# Patient Record
Sex: Female | Born: 1960
Health system: Southern US, Community
[De-identification: ages and names within clinical notes are randomized; demographics above are authoritative.]

## PROBLEM LIST (undated history)

## (undated) DIAGNOSIS — R7303 Prediabetes: Secondary | ICD-10-CM

## (undated) DIAGNOSIS — E559 Vitamin D deficiency, unspecified: Secondary | ICD-10-CM

## (undated) DIAGNOSIS — E785 Hyperlipidemia, unspecified: Secondary | ICD-10-CM

## (undated) DIAGNOSIS — I10 Essential (primary) hypertension: Secondary | ICD-10-CM

## (undated) DIAGNOSIS — D509 Iron deficiency anemia, unspecified: Secondary | ICD-10-CM

## (undated) DIAGNOSIS — D281 Benign neoplasm of vagina: Secondary | ICD-10-CM

## (undated) DIAGNOSIS — E040 Nontoxic diffuse goiter: Secondary | ICD-10-CM

## (undated) DIAGNOSIS — Z85828 Personal history of other malignant neoplasm of skin: Secondary | ICD-10-CM

## (undated) DIAGNOSIS — R5383 Other fatigue: Secondary | ICD-10-CM

## (undated) HISTORY — DX: Benign neoplasm of vagina: D28.1

## (undated) HISTORY — PX: BREAST CYST ASPIRATION: SHX578

## (undated) HISTORY — DX: Nontoxic diffuse goiter: E04.0

## (undated) HISTORY — DX: Vitamin D deficiency, unspecified: E55.9

## (undated) HISTORY — DX: Hyperlipidemia, unspecified: E78.5

## (undated) HISTORY — PX: APPENDECTOMY: SHX54

## (undated) HISTORY — DX: Essential (primary) hypertension: I10

## (undated) HISTORY — DX: Other fatigue: R53.83

## (undated) HISTORY — DX: Iron deficiency anemia, unspecified: D50.9

## (undated) HISTORY — DX: Personal history of other malignant neoplasm of skin: Z85.828

## (undated) HISTORY — DX: Prediabetes: R73.03

## (undated) HISTORY — PX: ABDOMINAL HYSTERECTOMY: SHX81

## (undated) HISTORY — PX: KNEE SURGERY: SHX244

---

## 2017-03-15 DIAGNOSIS — R5382 Chronic fatigue, unspecified: Secondary | ICD-10-CM | POA: Diagnosis not present

## 2017-03-15 DIAGNOSIS — E559 Vitamin D deficiency, unspecified: Secondary | ICD-10-CM | POA: Diagnosis not present

## 2017-03-15 DIAGNOSIS — E040 Nontoxic diffuse goiter: Secondary | ICD-10-CM | POA: Diagnosis not present

## 2017-03-15 DIAGNOSIS — E782 Mixed hyperlipidemia: Secondary | ICD-10-CM | POA: Diagnosis not present

## 2017-03-15 DIAGNOSIS — Z0000 Encounter for general adult medical examination without abnormal findings: Secondary | ICD-10-CM | POA: Diagnosis not present

## 2017-03-15 DIAGNOSIS — D509 Iron deficiency anemia, unspecified: Secondary | ICD-10-CM | POA: Diagnosis not present

## 2017-03-15 DIAGNOSIS — Z85828 Personal history of other malignant neoplasm of skin: Secondary | ICD-10-CM | POA: Diagnosis not present

## 2017-04-07 ENCOUNTER — Other Ambulatory Visit (HOSPITAL_BASED_OUTPATIENT_CLINIC_OR_DEPARTMENT_OTHER): Payer: Self-pay | Admitting: Internal Medicine

## 2017-04-07 DIAGNOSIS — Z1231 Encounter for screening mammogram for malignant neoplasm of breast: Secondary | ICD-10-CM

## 2017-04-12 ENCOUNTER — Encounter (HOSPITAL_BASED_OUTPATIENT_CLINIC_OR_DEPARTMENT_OTHER): Payer: Self-pay | Admitting: Radiology

## 2017-04-12 ENCOUNTER — Ambulatory Visit (HOSPITAL_BASED_OUTPATIENT_CLINIC_OR_DEPARTMENT_OTHER)
Admission: RE | Admit: 2017-04-12 | Discharge: 2017-04-12 | Disposition: A | Discharge status: Home / Self Care | Payer: 59 | Source: Ambulatory Visit | Attending: Internal Medicine | Admitting: Internal Medicine

## 2017-04-12 DIAGNOSIS — Z1231 Encounter for screening mammogram for malignant neoplasm of breast: Secondary | ICD-10-CM | POA: Diagnosis not present

## 2017-04-15 MED FILL — ATORVASTATIN 20 MG TABLET: 20 | 60 days supply | Qty: 60 | Fill #0

## 2017-04-15 MED FILL — LEVOTHYROXINE 75 MCG TABLET: 75 | 30 days supply | Qty: 30 | Fill #0

## 2017-05-07 DIAGNOSIS — R5382 Chronic fatigue, unspecified: Secondary | ICD-10-CM | POA: Diagnosis not present

## 2017-05-07 DIAGNOSIS — I10 Essential (primary) hypertension: Secondary | ICD-10-CM | POA: Diagnosis not present

## 2017-05-07 MED FILL — LOSARTAN POTASSIUM 50 MG TA: 50 | 90 days supply | Qty: 90 | Fill #0

## 2017-05-24 MED FILL — LEVOTHYROXINE 75 MCG TABLET: 75 | 30 days supply | Qty: 30 | Fill #1

## 2017-06-02 DIAGNOSIS — R748 Abnormal levels of other serum enzymes: Secondary | ICD-10-CM | POA: Diagnosis not present

## 2017-06-29 MED FILL — LEVOTHYROXINE 75 MCG TABLET: 75 | 90 days supply | Qty: 90 | Fill #0

## 2017-07-05 MED FILL — ATORVASTATIN 20 MG TABLET: 20 | 60 days supply | Qty: 60 | Fill #0

## 2017-08-09 MED FILL — LOSARTAN POTASSIUM 50 MG TA: 50 | 90 days supply | Qty: 90 | Fill #1

## 2017-08-24 DIAGNOSIS — M25461 Effusion, right knee: Secondary | ICD-10-CM | POA: Diagnosis not present

## 2017-08-24 DIAGNOSIS — S93421A Sprain of deltoid ligament of right ankle, initial encounter: Secondary | ICD-10-CM | POA: Diagnosis not present

## 2017-09-02 MED FILL — ATORVASTATIN 20 MG TABLET: 20 | 90 days supply | Qty: 90 | Fill #1

## 2017-09-29 DIAGNOSIS — M25461 Effusion, right knee: Secondary | ICD-10-CM | POA: Diagnosis not present

## 2017-09-29 DIAGNOSIS — M7661 Achilles tendinitis, right leg: Secondary | ICD-10-CM | POA: Diagnosis not present

## 2017-09-29 DIAGNOSIS — S93421A Sprain of deltoid ligament of right ankle, initial encounter: Secondary | ICD-10-CM | POA: Diagnosis not present

## 2017-09-30 MED FILL — LEVOTHYROXINE 75 MCG TABLET: 75 | 90 days supply | Qty: 90 | Fill #1

## 2017-11-02 MED FILL — LOSARTAN POTASSIUM 100 MG T: 100 | 90 days supply | Qty: 90 | Fill #0

## 2017-12-03 MED FILL — ATORVASTATIN 20 MG TABLET: 20 | 30 days supply | Qty: 30 | Fill #2

## 2017-12-29 MED FILL — LEVOTHYROXINE 75 MCG TABLET: 75 | 60 days supply | Qty: 60 | Fill #2

## 2017-12-29 MED FILL — ATORVASTATIN 20 MG TABLET: 20 | 60 days supply | Qty: 60 | Fill #0

## 2018-02-24 MED FILL — LEVOTHYROXINE 75 MCG TABLET: 75 | 90 days supply | Qty: 90 | Fill #0

## 2018-02-24 MED FILL — LOSARTAN POTASSIUM 100 MG T: 100 | 90 days supply | Qty: 90 | Fill #0

## 2018-03-21 MED FILL — ATORVASTATIN 20 MG TABLET: 20 | 60 days supply | Qty: 60 | Fill #0

## 2018-03-30 DIAGNOSIS — R7303 Prediabetes: Secondary | ICD-10-CM | POA: Diagnosis not present

## 2018-03-30 DIAGNOSIS — E559 Vitamin D deficiency, unspecified: Secondary | ICD-10-CM | POA: Diagnosis not present

## 2018-03-30 DIAGNOSIS — R5382 Chronic fatigue, unspecified: Secondary | ICD-10-CM | POA: Diagnosis not present

## 2018-03-30 DIAGNOSIS — Z0000 Encounter for general adult medical examination without abnormal findings: Secondary | ICD-10-CM | POA: Diagnosis not present

## 2018-03-30 DIAGNOSIS — E040 Nontoxic diffuse goiter: Secondary | ICD-10-CM | POA: Diagnosis not present

## 2018-03-30 DIAGNOSIS — E782 Mixed hyperlipidemia: Secondary | ICD-10-CM | POA: Diagnosis not present

## 2018-03-30 DIAGNOSIS — I10 Essential (primary) hypertension: Secondary | ICD-10-CM | POA: Diagnosis not present

## 2018-03-30 DIAGNOSIS — D509 Iron deficiency anemia, unspecified: Secondary | ICD-10-CM | POA: Diagnosis not present

## 2018-05-16 MED FILL — ATORVASTATIN CALCIUM 20 MG: 20 | 90 days supply | Qty: 90 | Fill #0

## 2018-05-31 MED FILL — LEVOTHYROXINE 75 MCG TABLET: 75 | 90 days supply | Qty: 90 | Fill #0

## 2018-08-18 MED FILL — ATORVASTATIN CALCIUM 20 MG: 20 | 90 days supply | Qty: 90 | Fill #1

## 2018-09-13 MED FILL — LEVOTHYROXINE 75 MCG TABLET: 75 | 90 days supply | Qty: 90 | Fill #1

## 2018-11-14 MED FILL — ATORVASTATIN 20 MG TABLET: 20 | 90 days supply | Qty: 90 | Fill #0

## 2018-11-14 MED FILL — LOSARTAN POTASSIUM 100 MG T: 100 | 90 days supply | Qty: 90 | Fill #0

## 2018-11-22 MED FILL — LEVOTHYROXINE 75 MCG TABLET: 75 | 90 days supply | Qty: 90 | Fill #0

## 2019-02-14 MED FILL — ATORVASTATIN 20 MG TABLET: 20 | 90 days supply | Qty: 90 | Fill #1

## 2019-03-15 MED FILL — LEVOTHYROXINE 75 MCG TABLET: 75 | 90 days supply | Qty: 90 | Fill #1

## 2019-04-12 DIAGNOSIS — E040 Nontoxic diffuse goiter: Secondary | ICD-10-CM | POA: Diagnosis not present

## 2019-04-12 DIAGNOSIS — Z0000 Encounter for general adult medical examination without abnormal findings: Secondary | ICD-10-CM | POA: Diagnosis not present

## 2019-04-12 DIAGNOSIS — R7303 Prediabetes: Secondary | ICD-10-CM | POA: Diagnosis not present

## 2019-04-12 DIAGNOSIS — E559 Vitamin D deficiency, unspecified: Secondary | ICD-10-CM | POA: Diagnosis not present

## 2019-04-12 DIAGNOSIS — D509 Iron deficiency anemia, unspecified: Secondary | ICD-10-CM | POA: Diagnosis not present

## 2019-04-12 DIAGNOSIS — R5382 Chronic fatigue, unspecified: Secondary | ICD-10-CM | POA: Diagnosis not present

## 2019-04-12 DIAGNOSIS — I10 Essential (primary) hypertension: Secondary | ICD-10-CM | POA: Diagnosis not present

## 2019-04-12 DIAGNOSIS — E782 Mixed hyperlipidemia: Secondary | ICD-10-CM | POA: Diagnosis not present

## 2019-04-12 DIAGNOSIS — Z1211 Encounter for screening for malignant neoplasm of colon: Secondary | ICD-10-CM | POA: Diagnosis not present

## 2019-04-25 ENCOUNTER — Other Ambulatory Visit: Payer: Self-pay | Admitting: Specialist

## 2019-04-25 DIAGNOSIS — Z1231 Encounter for screening mammogram for malignant neoplasm of breast: Secondary | ICD-10-CM

## 2019-05-03 ENCOUNTER — Encounter: Payer: Self-pay | Admitting: Gastroenterology

## 2019-05-08 ENCOUNTER — Other Ambulatory Visit: Payer: Self-pay

## 2019-05-08 ENCOUNTER — Encounter (HOSPITAL_BASED_OUTPATIENT_CLINIC_OR_DEPARTMENT_OTHER): Payer: Self-pay

## 2019-05-08 ENCOUNTER — Ambulatory Visit (HOSPITAL_BASED_OUTPATIENT_CLINIC_OR_DEPARTMENT_OTHER)
Admission: RE | Admit: 2019-05-08 | Discharge: 2019-05-08 | Disposition: A | Discharge status: Home / Self Care | Payer: 59 | Source: Ambulatory Visit | Attending: Specialist | Admitting: Specialist

## 2019-05-08 DIAGNOSIS — Z1231 Encounter for screening mammogram for malignant neoplasm of breast: Secondary | ICD-10-CM | POA: Insufficient documentation

## 2019-05-10 MED FILL — ATORVASTATIN 40 MG TABLET: 40 | 90 days supply | Qty: 90 | Fill #0

## 2019-05-10 MED FILL — LOSARTAN POTASSIUM 100 MG T: 100 | 90 days supply | Qty: 90 | Fill #1

## 2019-05-11 ENCOUNTER — Ambulatory Visit: Payer: 59 | Admitting: Gastroenterology

## 2019-05-11 ENCOUNTER — Encounter: Payer: Self-pay | Admitting: Gastroenterology

## 2019-05-11 ENCOUNTER — Other Ambulatory Visit: Payer: Self-pay

## 2019-05-11 VITALS — BP 124/84 | HR 72 | Temp 98.4°F | Ht 71.0 in | Wt 186.1 lb

## 2019-05-11 DIAGNOSIS — Z1212 Encounter for screening for malignant neoplasm of rectum: Secondary | ICD-10-CM

## 2019-05-11 DIAGNOSIS — Z1211 Encounter for screening for malignant neoplasm of colon: Secondary | ICD-10-CM | POA: Diagnosis not present

## 2019-05-11 MED ORDER — CLENPIQ 10-3.5-12 MG-GM -GM/160ML PO SOLN: 1.0000 | ORAL | 0 refills | Status: DC

## 2019-05-11 MED FILL — CLENPIQ 10-3.5-12 MG-GM -GM: 10-3.5-12 M | 1 days supply | Qty: 320 | Fill #0

## 2019-05-11 NOTE — Progress Notes (Signed)
Chief Complaint: For colorectal cancer screening  Referring Provider:  Raina Mina., MD      ASSESSMENT AND PLAN;   #1.  Colorectal cancer screening  Plan: - Proceed with colonoscopy with suprep/clenpiq. Discussed risks & benefits. Patient agrees to proceed. All the questions were answered. Consent forms given for review.  HPI:    Jackie Washington (Jackie Washington)is a 58 y.o. female  Dr Marthann Schiller wife No nausea, vomiting, regurgitation, odynophagia or dysphagia.  No significant diarrhea or constipation.  There is no melena or hematochezia. No unintentional weight loss.  Occ hearburn 1/month, better with Pepto and TUMS.  Occ problems with hoids. Better with prep H.   Past Medical History:  Diagnosis Date  . Fatigue   . Goiter diffuse, nontoxic   . History of basal cell carcinoma   . Hyperlipidemia   . Hypertension   . Iron deficiency anemia   . Prediabetes   . Vaginal neoplasm benign   . Vitamin D deficiency     Past Surgical History:  Procedure Laterality Date  . ABDOMINAL HYSTERECTOMY    . APPENDECTOMY    . BREAST CYST ASPIRATION     8 years ago unsure of laterality   . KNEE SURGERY      Family History  Problem Relation Age of Onset  . Colon cancer Neg Hx   . Esophageal cancer Neg Hx     Social History   Tobacco Use  . Smoking status: Never Smoker  . Smokeless tobacco: Never Used  Substance Use Topics  . Alcohol use: Yes    Comment: ocassionally  . Drug use: Not Currently    Current Outpatient Medications  Medication Sig Dispense Refill  . atorvastatin (LIPITOR) 20 MG tablet 1 tablet daily.    [name] levothyroxine (SYNTHROID) 75 MCG tablet 1 tablet daily.    [name] losartan (COZAAR) 50 MG tablet Take 50 mg by mouth daily.    . Multiple Vitamin (MULTI-VITAMIN) tablet Take 1 tablet by mouth daily.     No current facility-administered medications for this visit.     Not on File  Review of Systems:  Constitutional: Denies fever, chills, diaphoresis,  appetite change and fatigue.  HEENT: Denies photophobia, eye pain, redness, hearing loss, ear pain, congestion, sore throat, rhinorrhea, sneezing, mouth sores, neck pain, neck stiffness and tinnitus.   Respiratory: Denies SOB, DOE, cough, chest tightness,  and wheezing.   Cardiovascular: Denies chest pain, palpitations and leg swelling.  Genitourinary: Denies dysuria, urgency, frequency, hematuria, flank pain and difficulty urinating.  Musculoskeletal: Denies myalgias, back pain, joint swelling, arthralgias and gait problem.  Skin: No rash.  Neurological: Denies dizziness, seizures, syncope, weakness, light-headedness, numbness and headaches.  Hematological: Denies adenopathy. Easy bruising, personal or family bleeding history  Psychiatric/Behavioral: No anxiety or depression     Physical Exam:    BP 124/84   Pulse 72   Temp 98.4 F (36.9 C)   Ht 5\' 11"  (1.803 m)   Wt 186 lb 2 oz (84.4 kg)   BMI 25.96 kg/m  Filed Weights   05/11/19 1140  Weight: 186 lb 2 oz (84.4 kg)   Constitutional:  Well-developed, in no acute distress. Psychiatric: Normal mood and affect. Behavior is normal. HEENT: Pupils normal.  Conjunctivae are normal. No scleral icterus. Neck supple.  Cardiovascular: Normal rate, regular rhythm. No edema Pulmonary/chest: Effort normal and breath sounds normal. No wheezing, rales or rhonchi. Abdominal: Soft, nondistended. Nontender. Bowel sounds active throughout. There are no masses palpable. No hepatomegaly. Rectal:  defered Neurological: Alert and oriented to person place and time. Skin: Skin is warm and dry. No rashes noted.    Carmell Austria, MD 05/11/2019, 11:41 AM  Cc: Raina Mina., MD

## 2019-05-11 NOTE — Patient Instructions (Signed)
If you are age 57 or older, your body mass index should be between 23-30. Your Body mass index is 25.96 kg/m. If this is out of the aforementioned range listed, please consider follow up with your Primary Care Provider.  If you are age 58 or younger, your body mass index should be between 19-25. Your Body mass index is 25.96 kg/m. If this is out of the aformentioned range listed, please consider follow up with your Primary Care Provider.   To help prevent the possible spread of infection to our patients, communities, and staff; we will be implementing the following measures:  As of now we are not allowing any visitors/family members to accompany you to any upcoming appointments with [facility]. If you have any concerns about this please contact our office to discuss prior to the appointment.   We have sent the following medications to your pharmacy for you to pick up at your convenience: Clenpiq   You have been scheduled for a colonoscopy. Please follow written instructions given to you at your visit today.  Please pick up your prep supplies at the pharmacy within the next 1-3 days. If you use inhalers (even only as needed), please bring them with you on the day of your procedure. Your physician has requested that you go to www.startemmi.com and enter the access code given to you at your visit today. This web site gives a general overview about your procedure. However, you should still follow specific instructions given to you by our office regarding your preparation for the procedure.  Thank you,  Dr. Jackquline Denmark

## 2019-06-08 ENCOUNTER — Encounter: Payer: Self-pay | Admitting: Gastroenterology

## 2019-06-12 MED FILL — LEVOTHYROXINE 75 MCG TABLET: 75 | 90 days supply | Qty: 90 | Fill #0

## 2019-06-21 ENCOUNTER — Telehealth: Payer: Self-pay

## 2019-06-21 NOTE — Telephone Encounter (Signed)
Covid-19 screening questions   Do you now or have you had a fever in the last 14 days? NO   Do you have any respiratory symptoms of shortness of breath or cough now or in the last 14 days? NO  Do you have any family members or close contacts with diagnosed or suspected Covid-19 in the past 14 days? NO  Have you been tested for Covid-19 and found to be positive? NO        

## 2019-06-22 ENCOUNTER — Encounter: Payer: Self-pay | Admitting: Gastroenterology

## 2019-06-22 ENCOUNTER — Other Ambulatory Visit: Payer: Self-pay

## 2019-06-22 ENCOUNTER — Ambulatory Visit (AMBULATORY_SURGERY_CENTER): Payer: 59 | Admitting: Gastroenterology

## 2019-06-22 VITALS — BP 128/84 | HR 65 | Temp 97.7°F | Resp 11 | Ht 71.0 in | Wt 186.0 lb

## 2019-06-22 DIAGNOSIS — D122 Benign neoplasm of ascending colon: Secondary | ICD-10-CM | POA: Diagnosis not present

## 2019-06-22 DIAGNOSIS — Z1211 Encounter for screening for malignant neoplasm of colon: Secondary | ICD-10-CM

## 2019-06-22 MED ORDER — SODIUM CHLORIDE 0.9 % IV SOLN: 500.0000 mL | Freq: Once | INTRAVENOUS | Status: DC

## 2019-06-22 NOTE — Op Note (Signed)
[place] Patient Name: Jackie Washington Procedure Date: 06/22/2019 10:20 AM MRN: [id number] Endoscopist: Jackquline Denmark , MD Age: 58 Referring MD:  Date of Birth: May 23, 1961 Gender: Female Account #: [id number] Procedure:                Colonoscopy Indications:              Screening for colorectal malignant neoplasm Medicines:                Monitored Anesthesia Care Procedure:                Pre-Anesthesia Assessment:                           - Prior to the procedure, a History and Physical                            was performed, and patient medications and                            allergies were reviewed. The patient's tolerance of                            previous anesthesia was also reviewed. The risks                            and benefits of the procedure and the sedation                            options and risks were discussed with the patient.                            All questions were answered, and informed consent                            was obtained. Prior Anticoagulants: The patient has                            taken no previous anticoagulant or antiplatelet                            agents. ASA Grade Assessment: I - A normal, healthy                            patient. After reviewing the risks and benefits,                            the patient was deemed in satisfactory condition to                            undergo the procedure.                           After obtaining informed consent, the colonoscope  was passed under direct vision. Throughout the                            procedure, the patient's blood pressure, pulse, and                            oxygen saturations were monitored continuously. The                            Colonoscope was introduced through the anus and                            advanced to the 2 cm into the ileum. The                            colonoscopy was performed  without difficulty. The                            patient tolerated the procedure well. The quality                            of the bowel preparation was excellent. The                            terminal ileum, ileocecal valve, appendiceal                            orifice, and rectum were photographed. Scope In: 10:28:21 AM Scope Out: 10:39:15 AM Scope Withdrawal Time: 0 hours 7 minutes 10 seconds  Total Procedure Duration: 0 hours 10 minutes 54 seconds  Findings:                 A 4 mm polyp was found in the proximal ascending                            colon. The polyp was sessile. The polyp was removed                            with a cold biopsy forceps. Resection and retrieval                            were complete.                           A few small-mouthed diverticula were found in the                            sigmoid colon.                           Non-bleeding internal hemorrhoids were found during                            retroflexion. The hemorrhoids were small.  The terminal ileum appeared normal.                           The exam was otherwise without abnormality. Complications:            No immediate complications. Estimated Blood Loss:     Estimated blood loss: none. Impression:               -Small colonic polyp s/p polypectomy.                           -Mild sigmoid diverticulosis                           -Otherwise normal colonoscopy to TI. Recommendation:           - Patient has a contact number available for                            emergencies. The signs and symptoms of potential                            delayed complications were discussed with the                            patient. Return to normal activities tomorrow.                            Written discharge instructions were provided to the                            patient.                           - Resume previous diet.                           -  Continue present medications.                           - Await pathology results.                           - Repeat colonoscopy for surveillance based on                            pathology results.                           - Return to GI clinic PRN. Jackquline Denmark, MD 06/22/2019 10:43:19 AM This report has been signed electronically.

## 2019-06-22 NOTE — Patient Instructions (Signed)
HANDOUTS PROVIDED ON: POLYPS & DIVERTICULOSIS  THE POLYP REMOVED HAS BEEN SENT OFF FOR PATHOLOGY.  THE RESULTS USUALLY TAKE 10-14 DAYS TO RECEIVE.  YOU MAY RESUME YOUR PREVIOUS DIET AND MEDICATION SCHEDULE.  YOU HAD AN ENDOSCOPIC PROCEDURE TODAY AT [facility]:   Refer to the procedure report that was given to you for any specific questions about what was found during the examination.  If the procedure report does not answer your questions, please call your gastroenterologist to clarify.  If you requested that your care partner not be given the details of your procedure findings, then the procedure report has been included in a sealed envelope for you to review at your convenience later.  YOU SHOULD EXPECT: Some feelings of bloating in the abdomen. Passage of more gas than usual.  Walking can help get rid of the air that was put into your GI tract during the procedure and reduce the bloating. If you had a lower endoscopy (such as a colonoscopy or flexible sigmoidoscopy) you may notice spotting of blood in your stool or on the toilet paper. If you underwent a bowel prep for your procedure, you may not have a normal bowel movement for a few days.  Please Note:  You might notice some irritation and congestion in your nose or some drainage.  This is from the oxygen used during your procedure.  There is no need for concern and it should clear up in a day or so.  SYMPTOMS TO REPORT IMMEDIATELY:   Following lower endoscopy (colonoscopy or flexible sigmoidoscopy):  Excessive amounts of blood in the stool  Significant tenderness or worsening of abdominal pains  Swelling of the abdomen that is new, acute  Fever of 100F or higher  For urgent or emergent issues, a gastroenterologist can be reached at any hour by calling [phone].   DIET:  We do recommend a small meal at first, but then you may proceed to your regular diet.  Drink plenty of fluids but you should avoid alcoholic  beverages for 24 hours.  ACTIVITY:  You should plan to take it easy for the rest of today and you should NOT DRIVE or use heavy machinery until tomorrow (because of the sedation medicines used during the test).    FOLLOW UP: Our staff will call the number listed on your records 48-72 hours following your procedure to check on you and address any questions or concerns that you may have regarding the information given to you following your procedure. If we do not reach you, we will leave a message.  We will attempt to reach you two times.  During this call, we will ask if you have developed any symptoms of COVID 19. If you develop any symptoms (ie: fever, flu-like symptoms, shortness of breath, cough etc.) before then, please call [phone].  If you test positive for Covid 19 in the 2 weeks post procedure, please call and report this information to [place].    If any biopsies were taken you will be contacted by phone or by letter within the next 1-3 weeks.  Please call us at [phone] if you have not heard about the biopsies in 3 weeks.    SIGNATURES/CONFIDENTIALITY: You and/or your care partner have signed paperwork which will be entered into your electronic medical record.  These signatures attest to the fact that that the information above on your After Visit Summary has been reviewed and is understood.  Full responsibility of the confidentiality of this  discharge information lies with you and/or your care-partner. 

## 2019-06-22 NOTE — Progress Notes (Signed)
Called to room to assist during endoscopic procedure.  Patient ID and intended procedure confirmed with present staff. Received instructions for my participation in the procedure from the performing physician.  

## 2019-06-22 NOTE — Progress Notes (Signed)
PT taken to PACU. Monitors in place. VSS. Report given to RN. 

## 2019-06-22 NOTE — Progress Notes (Signed)
Pt's states no medical or surgical changes since previsit or office visit.  JB - temp CW - vitals. 

## 2019-06-26 ENCOUNTER — Encounter: Payer: Self-pay | Admitting: Gastroenterology

## 2019-06-26 ENCOUNTER — Telehealth: Payer: Self-pay

## 2019-06-26 NOTE — Telephone Encounter (Signed)
  Follow up Call-  Call back number 06/22/2019  Post procedure Call Back phone  # [phone]  Permission to leave phone message Yes     Patient questions:  Do you have a fever, pain , or abdominal swelling? No. Pain Score  0 *  Have you tolerated food without any problems? Yes.    Have you been able to return to your normal activities? Yes.    Do you have any questions about your discharge instructions: Diet   No. Medications  No. Follow up visit  No.  Do you have questions or concerns about your Care? No.  Actions: * If pain score is 4 or above: No action needed, pain <4. 1. Have you developed a fever since your procedure? no  2.   Have you had an respiratory symptoms (SOB or cough) since your procedure? no  3.   Have you tested positive for COVID 19 since your procedure no  4.   Have you had any family members/close contacts diagnosed with the COVID 19 since your procedure?  no   If yes to any of these questions please route to Joylene John, RN and Alphonsa Gin, Therapist, sports.

## 2019-07-10 ENCOUNTER — Other Ambulatory Visit: Payer: Self-pay

## 2019-07-10 DIAGNOSIS — Z20822 Contact with and (suspected) exposure to covid-19: Secondary | ICD-10-CM

## 2019-07-11 LAB — NOVEL CORONAVIRUS, NAA: SARS-CoV-2, NAA: NOT DETECTED

## 2019-08-08 MED FILL — ATORVASTATIN 40 MG TABLET: 40 | 90 days supply | Qty: 90 | Fill #1

## 2019-09-20 MED FILL — LEVOTHYROXINE 75 MCG TABLET: 75 | 90 days supply | Qty: 90 | Fill #1

## 2019-11-02 MED FILL — LOSARTAN POTASSIUM 100 MG T: 100 | 90 days supply | Qty: 90 | Fill #0

## 2019-11-08 MED FILL — ATORVASTATIN 40 MG TABLET: 40 | 90 days supply | Qty: 90 | Fill #2

## 2019-12-06 DIAGNOSIS — R5382 Chronic fatigue, unspecified: Secondary | ICD-10-CM | POA: Diagnosis not present

## 2019-12-06 DIAGNOSIS — I10 Essential (primary) hypertension: Secondary | ICD-10-CM | POA: Diagnosis not present

## 2019-12-06 DIAGNOSIS — D509 Iron deficiency anemia, unspecified: Secondary | ICD-10-CM | POA: Diagnosis not present

## 2019-12-06 DIAGNOSIS — E782 Mixed hyperlipidemia: Secondary | ICD-10-CM | POA: Diagnosis not present

## 2019-12-06 DIAGNOSIS — R7303 Prediabetes: Secondary | ICD-10-CM | POA: Diagnosis not present

## 2019-12-06 DIAGNOSIS — Z85828 Personal history of other malignant neoplasm of skin: Secondary | ICD-10-CM | POA: Diagnosis not present

## 2019-12-06 DIAGNOSIS — E039 Hypothyroidism, unspecified: Secondary | ICD-10-CM | POA: Diagnosis not present

## 2019-12-06 DIAGNOSIS — E559 Vitamin D deficiency, unspecified: Secondary | ICD-10-CM | POA: Diagnosis not present

## 2019-12-06 DIAGNOSIS — D126 Benign neoplasm of colon, unspecified: Secondary | ICD-10-CM | POA: Diagnosis not present

## 2019-12-14 DIAGNOSIS — H43391 Other vitreous opacities, right eye: Secondary | ICD-10-CM | POA: Diagnosis not present

## 2019-12-14 DIAGNOSIS — H53141 Visual discomfort, right eye: Secondary | ICD-10-CM | POA: Diagnosis not present

## 2019-12-14 DIAGNOSIS — H21341 Primary cyst of pars plana, right eye: Secondary | ICD-10-CM | POA: Diagnosis not present

## 2019-12-14 DIAGNOSIS — H25013 Cortical age-related cataract, bilateral: Secondary | ICD-10-CM | POA: Diagnosis not present

## 2019-12-14 DIAGNOSIS — H43811 Vitreous degeneration, right eye: Secondary | ICD-10-CM | POA: Diagnosis not present

## 2019-12-21 MED FILL — LEVOTHYROXINE SODIUM 75 MCG: 75 | 90 days supply | Qty: 90 | Fill #0

## 2019-12-28 DIAGNOSIS — H53141 Visual discomfort, right eye: Secondary | ICD-10-CM | POA: Diagnosis not present

## 2019-12-28 DIAGNOSIS — H43391 Other vitreous opacities, right eye: Secondary | ICD-10-CM | POA: Diagnosis not present

## 2019-12-28 DIAGNOSIS — H21341 Primary cyst of pars plana, right eye: Secondary | ICD-10-CM | POA: Diagnosis not present

## 2019-12-28 DIAGNOSIS — H43811 Vitreous degeneration, right eye: Secondary | ICD-10-CM | POA: Diagnosis not present

## 2020-02-08 MED FILL — ATORVASTATIN CALCIUM 40 MG: 40 | 90 days supply | Qty: 90 | Fill #3

## 2020-02-28 DIAGNOSIS — H21341 Primary cyst of pars plana, right eye: Secondary | ICD-10-CM | POA: Diagnosis not present

## 2020-02-28 DIAGNOSIS — H43391 Other vitreous opacities, right eye: Secondary | ICD-10-CM | POA: Diagnosis not present

## 2020-02-28 DIAGNOSIS — H43811 Vitreous degeneration, right eye: Secondary | ICD-10-CM | POA: Diagnosis not present

## 2020-03-19 MED FILL — LEVOTHYROXINE SODIUM 75 MCG: 75 | 90 days supply | Qty: 90 | Fill #1

## 2020-05-07 ENCOUNTER — Other Ambulatory Visit (HOSPITAL_BASED_OUTPATIENT_CLINIC_OR_DEPARTMENT_OTHER): Payer: Self-pay | Admitting: Specialist

## 2020-05-07 MED FILL — ATORVASTATIN CALCIUM 40 MG: 40 | 90 days supply | Qty: 90 | Fill #0

## 2020-05-07 MED FILL — LOSARTAN POTASSIUM 100 MG T: 100 | 90 days supply | Qty: 90 | Fill #0

## 2020-05-28 ENCOUNTER — Other Ambulatory Visit (HOSPITAL_BASED_OUTPATIENT_CLINIC_OR_DEPARTMENT_OTHER): Payer: Self-pay | Admitting: Internal Medicine

## 2020-05-28 DIAGNOSIS — Z1231 Encounter for screening mammogram for malignant neoplasm of breast: Secondary | ICD-10-CM

## 2020-05-30 ENCOUNTER — Ambulatory Visit (HOSPITAL_BASED_OUTPATIENT_CLINIC_OR_DEPARTMENT_OTHER): Payer: 59

## 2020-06-14 ENCOUNTER — Other Ambulatory Visit (HOSPITAL_BASED_OUTPATIENT_CLINIC_OR_DEPARTMENT_OTHER): Payer: Self-pay | Admitting: Specialist

## 2020-06-14 MED FILL — LEVOTHYROXINE SODIUM 75 MCG: 75 | 90 days supply | Qty: 90 | Fill #0

## 2020-06-28 ENCOUNTER — Other Ambulatory Visit: Payer: Self-pay | Admitting: Internal Medicine

## 2020-06-28 DIAGNOSIS — Z1231 Encounter for screening mammogram for malignant neoplasm of breast: Secondary | ICD-10-CM

## 2020-08-06 ENCOUNTER — Ambulatory Visit
Admission: RE | Admit: 2020-08-06 | Discharge: 2020-08-06 | Disposition: A | Discharge status: Home / Self Care | Payer: 59 | Source: Ambulatory Visit | Attending: Internal Medicine | Admitting: Internal Medicine

## 2020-08-06 ENCOUNTER — Other Ambulatory Visit: Payer: Self-pay

## 2020-08-06 DIAGNOSIS — Z1231 Encounter for screening mammogram for malignant neoplasm of breast: Secondary | ICD-10-CM

## 2020-08-06 MED FILL — ATORVASTATIN CALCIUM 40 MG: 40 | 90 days supply | Qty: 90 | Fill #1

## 2020-08-25 DIAGNOSIS — Z23 Encounter for immunization: Secondary | ICD-10-CM | POA: Diagnosis not present

## 2020-09-09 ENCOUNTER — Ambulatory Visit (HOSPITAL_BASED_OUTPATIENT_CLINIC_OR_DEPARTMENT_OTHER): Payer: 59

## 2020-09-18 ENCOUNTER — Other Ambulatory Visit (HOSPITAL_BASED_OUTPATIENT_CLINIC_OR_DEPARTMENT_OTHER): Payer: Self-pay | Admitting: Specialist

## 2020-09-18 MED FILL — LEVOTHYROXINE SODIUM 75 MCG: 75 | 90 days supply | Qty: 90 | Fill #0

## 2020-10-30 DIAGNOSIS — M1611 Unilateral primary osteoarthritis, right hip: Secondary | ICD-10-CM | POA: Diagnosis not present

## 2020-10-30 DIAGNOSIS — M47816 Spondylosis without myelopathy or radiculopathy, lumbar region: Secondary | ICD-10-CM | POA: Diagnosis not present

## 2020-11-05 ENCOUNTER — Other Ambulatory Visit (HOSPITAL_BASED_OUTPATIENT_CLINIC_OR_DEPARTMENT_OTHER): Payer: Self-pay | Admitting: Internal Medicine

## 2020-11-05 MED FILL — LOSARTAN POTASSIUM 100 MG T: 100 | 90 days supply | Qty: 90 | Fill #0

## 2020-11-06 MED FILL — ATORVASTATIN CALCIUM 40 MG: 40 | 90 days supply | Qty: 90 | Fill #2

## 2020-12-16 ENCOUNTER — Other Ambulatory Visit (HOSPITAL_BASED_OUTPATIENT_CLINIC_OR_DEPARTMENT_OTHER): Payer: Self-pay

## 2020-12-17 ENCOUNTER — Other Ambulatory Visit (HOSPITAL_BASED_OUTPATIENT_CLINIC_OR_DEPARTMENT_OTHER): Payer: Self-pay

## 2020-12-17 MED ORDER — LEVOTHYROXINE SODIUM 75 MCG PO TABS
ORAL_TABLET | ORAL | 0 refills | Status: DC
  Filled 2020-12-17: qty 90, 90d supply, fill #0

## 2020-12-18 ENCOUNTER — Other Ambulatory Visit (HOSPITAL_BASED_OUTPATIENT_CLINIC_OR_DEPARTMENT_OTHER): Payer: Self-pay

## 2021-02-04 ENCOUNTER — Other Ambulatory Visit (HOSPITAL_BASED_OUTPATIENT_CLINIC_OR_DEPARTMENT_OTHER): Payer: Self-pay

## 2021-02-04 MED FILL — Atorvastatin Calcium Tab 40 MG (Base Equivalent): ORAL | 90 days supply | Qty: 90 | Fill #0 | Status: AC

## 2021-02-18 DIAGNOSIS — L820 Inflamed seborrheic keratosis: Secondary | ICD-10-CM | POA: Diagnosis not present

## 2021-02-18 DIAGNOSIS — D2371 Other benign neoplasm of skin of right lower limb, including hip: Secondary | ICD-10-CM | POA: Diagnosis not present

## 2021-02-18 DIAGNOSIS — L821 Other seborrheic keratosis: Secondary | ICD-10-CM | POA: Diagnosis not present

## 2021-02-18 DIAGNOSIS — L578 Other skin changes due to chronic exposure to nonionizing radiation: Secondary | ICD-10-CM | POA: Diagnosis not present

## 2021-03-15 ENCOUNTER — Other Ambulatory Visit (HOSPITAL_BASED_OUTPATIENT_CLINIC_OR_DEPARTMENT_OTHER): Payer: Self-pay

## 2021-03-17 ENCOUNTER — Other Ambulatory Visit (HOSPITAL_BASED_OUTPATIENT_CLINIC_OR_DEPARTMENT_OTHER): Payer: Self-pay

## 2021-03-17 MED ORDER — LEVOTHYROXINE SODIUM 75 MCG PO TABS
ORAL_TABLET | ORAL | 0 refills | Status: DC
  Filled 2021-03-17: qty 90, 90d supply, fill #0

## 2021-04-23 ENCOUNTER — Other Ambulatory Visit (HOSPITAL_BASED_OUTPATIENT_CLINIC_OR_DEPARTMENT_OTHER): Payer: Self-pay

## 2021-04-23 DIAGNOSIS — E049 Nontoxic goiter, unspecified: Secondary | ICD-10-CM | POA: Diagnosis not present

## 2021-04-23 DIAGNOSIS — Z0001 Encounter for general adult medical examination with abnormal findings: Secondary | ICD-10-CM | POA: Diagnosis not present

## 2021-04-23 DIAGNOSIS — I10 Essential (primary) hypertension: Secondary | ICD-10-CM | POA: Diagnosis not present

## 2021-04-23 DIAGNOSIS — Z79899 Other long term (current) drug therapy: Secondary | ICD-10-CM | POA: Diagnosis not present

## 2021-04-23 DIAGNOSIS — E559 Vitamin D deficiency, unspecified: Secondary | ICD-10-CM | POA: Diagnosis not present

## 2021-04-23 DIAGNOSIS — E782 Mixed hyperlipidemia: Secondary | ICD-10-CM | POA: Diagnosis not present

## 2021-04-23 DIAGNOSIS — Z0000 Encounter for general adult medical examination without abnormal findings: Secondary | ICD-10-CM | POA: Diagnosis not present

## 2021-04-23 DIAGNOSIS — R5382 Chronic fatigue, unspecified: Secondary | ICD-10-CM | POA: Diagnosis not present

## 2021-04-23 DIAGNOSIS — E040 Nontoxic diffuse goiter: Secondary | ICD-10-CM | POA: Diagnosis not present

## 2021-04-23 DIAGNOSIS — D509 Iron deficiency anemia, unspecified: Secondary | ICD-10-CM | POA: Diagnosis not present

## 2021-04-23 MED ORDER — LEVOTHYROXINE SODIUM 75 MCG PO TABS
75.0000 ug | ORAL_TABLET | Freq: Every day | ORAL | 3 refills | Status: DC
  Filled 2021-04-23 – 2021-06-24 (×2): qty 90, 90d supply, fill #0
  Filled 2021-09-23: qty 90, 90d supply, fill #1
  Filled 2021-12-22: qty 90, 90d supply, fill #2
  Filled 2022-03-12: qty 90, 90d supply, fill #3

## 2021-04-23 MED ORDER — ATORVASTATIN CALCIUM 40 MG PO TABS
40.0000 mg | ORAL_TABLET | Freq: Every day | ORAL | 3 refills | Status: DC
  Filled 2021-04-23: qty 90, 90d supply, fill #0

## 2021-04-23 MED ORDER — LOSARTAN POTASSIUM 50 MG PO TABS
50.0000 mg | ORAL_TABLET | Freq: Every day | ORAL | 3 refills | Status: DC
Start: 2021-04-23 — End: 2022-05-14
  Filled 2021-04-23: qty 90, 90d supply, fill #0
  Filled 2021-08-12: qty 90, 90d supply, fill #1
  Filled 2021-11-13: qty 90, 90d supply, fill #2
  Filled 2022-02-05: qty 90, 90d supply, fill #3

## 2021-04-25 ENCOUNTER — Other Ambulatory Visit (HOSPITAL_BASED_OUTPATIENT_CLINIC_OR_DEPARTMENT_OTHER): Payer: Self-pay

## 2021-04-25 MED ORDER — ATORVASTATIN CALCIUM 80 MG PO TABS
80.0000 mg | ORAL_TABLET | Freq: Every day | ORAL | 3 refills | Status: DC
  Filled 2021-04-25: qty 90, 90d supply, fill #0
  Filled 2021-09-23: qty 90, 90d supply, fill #1
  Filled 2021-12-22: qty 90, 90d supply, fill #2
  Filled 2022-03-12: qty 90, 90d supply, fill #3

## 2021-05-13 DIAGNOSIS — R5382 Chronic fatigue, unspecified: Secondary | ICD-10-CM | POA: Diagnosis not present

## 2021-06-24 ENCOUNTER — Other Ambulatory Visit (HOSPITAL_BASED_OUTPATIENT_CLINIC_OR_DEPARTMENT_OTHER): Payer: Self-pay

## 2021-08-12 ENCOUNTER — Other Ambulatory Visit (HOSPITAL_BASED_OUTPATIENT_CLINIC_OR_DEPARTMENT_OTHER): Payer: Self-pay

## 2021-09-23 ENCOUNTER — Other Ambulatory Visit (HOSPITAL_BASED_OUTPATIENT_CLINIC_OR_DEPARTMENT_OTHER): Payer: Self-pay

## 2021-09-29 ENCOUNTER — Other Ambulatory Visit (HOSPITAL_BASED_OUTPATIENT_CLINIC_OR_DEPARTMENT_OTHER): Payer: Self-pay | Admitting: Internal Medicine

## 2021-09-29 DIAGNOSIS — Z1231 Encounter for screening mammogram for malignant neoplasm of breast: Secondary | ICD-10-CM

## 2021-10-06 ENCOUNTER — Ambulatory Visit (HOSPITAL_BASED_OUTPATIENT_CLINIC_OR_DEPARTMENT_OTHER)
Admission: RE | Admit: 2021-10-06 | Discharge: 2021-10-06 | Disposition: A | Discharge status: Home / Self Care | Payer: 59 | Source: Ambulatory Visit | Attending: Internal Medicine | Admitting: Internal Medicine

## 2021-10-06 ENCOUNTER — Encounter (HOSPITAL_BASED_OUTPATIENT_CLINIC_OR_DEPARTMENT_OTHER): Payer: Self-pay

## 2021-10-06 ENCOUNTER — Other Ambulatory Visit: Payer: Self-pay

## 2021-10-06 DIAGNOSIS — Z1231 Encounter for screening mammogram for malignant neoplasm of breast: Secondary | ICD-10-CM | POA: Insufficient documentation

## 2021-11-13 ENCOUNTER — Other Ambulatory Visit (HOSPITAL_BASED_OUTPATIENT_CLINIC_OR_DEPARTMENT_OTHER): Payer: Self-pay

## 2021-12-22 ENCOUNTER — Other Ambulatory Visit (HOSPITAL_BASED_OUTPATIENT_CLINIC_OR_DEPARTMENT_OTHER): Payer: Self-pay

## 2022-02-05 ENCOUNTER — Other Ambulatory Visit (HOSPITAL_BASED_OUTPATIENT_CLINIC_OR_DEPARTMENT_OTHER): Payer: Self-pay

## 2022-02-27 ENCOUNTER — Other Ambulatory Visit (HOSPITAL_BASED_OUTPATIENT_CLINIC_OR_DEPARTMENT_OTHER): Payer: Self-pay

## 2022-03-12 ENCOUNTER — Other Ambulatory Visit (HOSPITAL_BASED_OUTPATIENT_CLINIC_OR_DEPARTMENT_OTHER): Payer: Self-pay

## 2022-03-16 ENCOUNTER — Other Ambulatory Visit (HOSPITAL_BASED_OUTPATIENT_CLINIC_OR_DEPARTMENT_OTHER): Payer: Self-pay

## 2022-03-17 ENCOUNTER — Other Ambulatory Visit (HOSPITAL_BASED_OUTPATIENT_CLINIC_OR_DEPARTMENT_OTHER): Payer: Self-pay

## 2022-03-17 MED ORDER — LEVOTHYROXINE SODIUM 75 MCG PO TABS
75.0000 ug | ORAL_TABLET | Freq: Every day | ORAL | 3 refills | Status: DC
Start: 2022-03-17 — End: 2023-03-29
  Filled 2022-03-17 (×3): qty 90, 90d supply, fill #0

## 2022-04-10 ENCOUNTER — Other Ambulatory Visit (HOSPITAL_BASED_OUTPATIENT_CLINIC_OR_DEPARTMENT_OTHER): Payer: Self-pay

## 2022-05-13 ENCOUNTER — Other Ambulatory Visit (HOSPITAL_BASED_OUTPATIENT_CLINIC_OR_DEPARTMENT_OTHER): Payer: Self-pay

## 2022-05-14 ENCOUNTER — Other Ambulatory Visit (HOSPITAL_BASED_OUTPATIENT_CLINIC_OR_DEPARTMENT_OTHER): Payer: Self-pay

## 2022-05-14 MED ORDER — LOSARTAN POTASSIUM 50 MG PO TABS
50.0000 mg | ORAL_TABLET | Freq: Every day | ORAL | 1 refills | Status: DC
  Filled 2022-05-14: qty 90, 90d supply, fill #0
  Filled 2022-06-25 – 2022-08-10 (×2): qty 90, 90d supply, fill #1

## 2022-06-02 ENCOUNTER — Other Ambulatory Visit (HOSPITAL_BASED_OUTPATIENT_CLINIC_OR_DEPARTMENT_OTHER): Payer: Self-pay

## 2022-06-02 ENCOUNTER — Other Ambulatory Visit (HOSPITAL_COMMUNITY): Payer: Self-pay

## 2022-06-02 DIAGNOSIS — R011 Cardiac murmur, unspecified: Secondary | ICD-10-CM | POA: Diagnosis not present

## 2022-06-02 DIAGNOSIS — E039 Hypothyroidism, unspecified: Secondary | ICD-10-CM | POA: Diagnosis not present

## 2022-06-02 DIAGNOSIS — Z1331 Encounter for screening for depression: Secondary | ICD-10-CM | POA: Diagnosis not present

## 2022-06-02 DIAGNOSIS — I10 Essential (primary) hypertension: Secondary | ICD-10-CM | POA: Diagnosis not present

## 2022-06-02 DIAGNOSIS — Z6827 Body mass index (BMI) 27.0-27.9, adult: Secondary | ICD-10-CM | POA: Diagnosis not present

## 2022-06-02 MED ORDER — LEVOTHYROXINE SODIUM 75 MCG PO TABS: 75.0000 ug | ORAL_TABLET | Freq: Every day | ORAL | 3 refills | Status: DC

## 2022-06-02 MED ORDER — ATORVASTATIN CALCIUM 80 MG PO TABS
80.0000 mg | ORAL_TABLET | Freq: Every day | ORAL | 3 refills | Status: DC
  Filled 2022-06-25: qty 90, 90d supply, fill #0
  Filled 2022-09-22: qty 90, 90d supply, fill #1
  Filled 2022-12-25: qty 90, 90d supply, fill #2
  Filled 2023-03-23: qty 90, 90d supply, fill #3

## 2022-06-02 MED ORDER — LOSARTAN POTASSIUM 50 MG PO TABS: 50.0000 mg | ORAL_TABLET | Freq: Every day | ORAL | 3 refills | Status: DC

## 2022-06-02 MED ORDER — LEVOTHYROXINE SODIUM 75 MCG PO TABS
75.0000 ug | ORAL_TABLET | Freq: Every day | ORAL | 3 refills | Status: DC
Start: 2022-06-02 — End: 2023-03-29

## 2022-06-08 ENCOUNTER — Other Ambulatory Visit (HOSPITAL_BASED_OUTPATIENT_CLINIC_OR_DEPARTMENT_OTHER): Payer: Self-pay

## 2022-06-08 DIAGNOSIS — R011 Cardiac murmur, unspecified: Secondary | ICD-10-CM | POA: Diagnosis not present

## 2022-06-09 ENCOUNTER — Other Ambulatory Visit (HOSPITAL_BASED_OUTPATIENT_CLINIC_OR_DEPARTMENT_OTHER): Payer: Self-pay

## 2022-06-09 MED ORDER — LEVOTHYROXINE SODIUM 75 MCG PO TABS
75.0000 ug | ORAL_TABLET | Freq: Every day | ORAL | 1 refills | Status: DC
  Filled 2022-06-09: qty 90, 90d supply, fill #0
  Filled 2022-06-25 – 2022-08-26 (×2): qty 90, 90d supply, fill #1

## 2022-06-25 ENCOUNTER — Other Ambulatory Visit (HOSPITAL_BASED_OUTPATIENT_CLINIC_OR_DEPARTMENT_OTHER): Payer: Self-pay

## 2022-08-10 ENCOUNTER — Other Ambulatory Visit (HOSPITAL_BASED_OUTPATIENT_CLINIC_OR_DEPARTMENT_OTHER): Payer: Self-pay

## 2022-08-27 ENCOUNTER — Other Ambulatory Visit: Payer: Self-pay

## 2022-09-21 ENCOUNTER — Other Ambulatory Visit (HOSPITAL_BASED_OUTPATIENT_CLINIC_OR_DEPARTMENT_OTHER): Payer: Self-pay

## 2022-09-21 MED ORDER — METOPROLOL SUCCINATE ER 25 MG PO TB24
25.0000 mg | ORAL_TABLET | Freq: Every day | ORAL | 1 refills | Status: DC
  Filled 2022-09-21: qty 90, 90d supply, fill #0
  Filled 2022-11-16: qty 90, 90d supply, fill #1

## 2022-09-29 ENCOUNTER — Other Ambulatory Visit (HOSPITAL_BASED_OUTPATIENT_CLINIC_OR_DEPARTMENT_OTHER): Payer: Self-pay | Admitting: Internal Medicine

## 2022-09-29 DIAGNOSIS — Z1231 Encounter for screening mammogram for malignant neoplasm of breast: Secondary | ICD-10-CM

## 2022-10-06 ENCOUNTER — Other Ambulatory Visit (HOSPITAL_BASED_OUTPATIENT_CLINIC_OR_DEPARTMENT_OTHER): Payer: Self-pay | Admitting: Family Medicine

## 2022-10-06 ENCOUNTER — Encounter (HOSPITAL_BASED_OUTPATIENT_CLINIC_OR_DEPARTMENT_OTHER): Payer: Self-pay

## 2022-10-06 ENCOUNTER — Ambulatory Visit (HOSPITAL_BASED_OUTPATIENT_CLINIC_OR_DEPARTMENT_OTHER)
Admission: RE | Admit: 2022-10-06 | Discharge: 2022-10-06 | Disposition: A | Discharge status: Home / Self Care | Payer: Commercial Managed Care - PPO | Source: Ambulatory Visit | Attending: Internal Medicine | Admitting: Internal Medicine

## 2022-10-06 DIAGNOSIS — Z1231 Encounter for screening mammogram for malignant neoplasm of breast: Secondary | ICD-10-CM

## 2022-11-16 ENCOUNTER — Other Ambulatory Visit (HOSPITAL_BASED_OUTPATIENT_CLINIC_OR_DEPARTMENT_OTHER): Payer: Self-pay

## 2022-11-16 MED ORDER — LOSARTAN POTASSIUM 100 MG PO TABS
100.0000 mg | ORAL_TABLET | Freq: Every day | ORAL | 3 refills | Status: DC
  Filled 2022-11-16: qty 90, 90d supply, fill #0

## 2022-11-16 MED ORDER — METOPROLOL SUCCINATE ER 50 MG PO TB24
50.0000 mg | ORAL_TABLET | Freq: Every day | ORAL | 1 refills | Status: DC
  Filled 2022-11-16: qty 90, 90d supply, fill #0

## 2022-11-17 ENCOUNTER — Other Ambulatory Visit (HOSPITAL_BASED_OUTPATIENT_CLINIC_OR_DEPARTMENT_OTHER): Payer: Self-pay

## 2022-11-18 ENCOUNTER — Other Ambulatory Visit (HOSPITAL_BASED_OUTPATIENT_CLINIC_OR_DEPARTMENT_OTHER): Payer: Self-pay

## 2022-11-18 MED ORDER — LOSARTAN POTASSIUM 50 MG PO TABS
50.0000 mg | ORAL_TABLET | Freq: Every day | ORAL | 0 refills | Status: DC
  Filled 2022-11-18: qty 90, 90d supply, fill #0

## 2022-11-25 ENCOUNTER — Other Ambulatory Visit (HOSPITAL_BASED_OUTPATIENT_CLINIC_OR_DEPARTMENT_OTHER): Payer: Self-pay

## 2022-11-25 MED ORDER — LEVOTHYROXINE SODIUM 75 MCG PO TABS
75.0000 ug | ORAL_TABLET | Freq: Every day | ORAL | 1 refills | Status: DC
  Filled 2022-11-25: qty 90, 90d supply, fill #0
  Filled 2023-02-17: qty 90, 90d supply, fill #1

## 2022-11-30 ENCOUNTER — Other Ambulatory Visit (HOSPITAL_BASED_OUTPATIENT_CLINIC_OR_DEPARTMENT_OTHER): Payer: Self-pay

## 2022-11-30 DIAGNOSIS — I10 Essential (primary) hypertension: Secondary | ICD-10-CM | POA: Diagnosis not present

## 2022-11-30 DIAGNOSIS — Z6827 Body mass index (BMI) 27.0-27.9, adult: Secondary | ICD-10-CM | POA: Diagnosis not present

## 2022-11-30 DIAGNOSIS — E039 Hypothyroidism, unspecified: Secondary | ICD-10-CM | POA: Diagnosis not present

## 2022-11-30 MED ORDER — CARVEDILOL 6.25 MG PO TABS
6.2500 mg | ORAL_TABLET | Freq: Two times a day (BID) | ORAL | 1 refills | Status: DC
  Filled 2022-11-30: qty 60, 30d supply, fill #0
  Filled 2022-12-25: qty 60, 30d supply, fill #1

## 2022-12-23 ENCOUNTER — Other Ambulatory Visit (HOSPITAL_BASED_OUTPATIENT_CLINIC_OR_DEPARTMENT_OTHER): Payer: Self-pay

## 2022-12-23 DIAGNOSIS — B351 Tinea unguium: Secondary | ICD-10-CM | POA: Diagnosis not present

## 2022-12-23 DIAGNOSIS — I10 Essential (primary) hypertension: Secondary | ICD-10-CM | POA: Diagnosis not present

## 2022-12-23 DIAGNOSIS — R002 Palpitations: Secondary | ICD-10-CM | POA: Diagnosis not present

## 2022-12-23 DIAGNOSIS — Z6827 Body mass index (BMI) 27.0-27.9, adult: Secondary | ICD-10-CM | POA: Diagnosis not present

## 2022-12-23 MED ORDER — LOSARTAN POTASSIUM 50 MG PO TABS: 50.0000 mg | ORAL_TABLET | Freq: Every day | ORAL | 3 refills | Status: DC

## 2022-12-23 MED ORDER — CARVEDILOL 12.5 MG PO TABS
12.5000 mg | ORAL_TABLET | Freq: Two times a day (BID) | ORAL | 1 refills | Status: DC
  Filled 2022-12-23: qty 60, 30d supply, fill #0

## 2022-12-24 ENCOUNTER — Other Ambulatory Visit (HOSPITAL_BASED_OUTPATIENT_CLINIC_OR_DEPARTMENT_OTHER): Payer: Self-pay

## 2022-12-25 ENCOUNTER — Other Ambulatory Visit (HOSPITAL_BASED_OUTPATIENT_CLINIC_OR_DEPARTMENT_OTHER): Payer: Self-pay

## 2022-12-29 ENCOUNTER — Other Ambulatory Visit (HOSPITAL_BASED_OUTPATIENT_CLINIC_OR_DEPARTMENT_OTHER): Payer: Self-pay

## 2022-12-29 MED ORDER — PROMETHAZINE HCL 25 MG PO TABS
25.0000 mg | ORAL_TABLET | Freq: Three times a day (TID) | ORAL | 0 refills | Status: DC | PRN
  Filled 2022-12-29: qty 20, 7d supply, fill #0

## 2023-01-06 DIAGNOSIS — R002 Palpitations: Secondary | ICD-10-CM | POA: Diagnosis not present

## 2023-01-17 ENCOUNTER — Other Ambulatory Visit (HOSPITAL_BASED_OUTPATIENT_CLINIC_OR_DEPARTMENT_OTHER): Payer: Self-pay

## 2023-01-18 ENCOUNTER — Other Ambulatory Visit (HOSPITAL_BASED_OUTPATIENT_CLINIC_OR_DEPARTMENT_OTHER): Payer: Self-pay

## 2023-01-18 ENCOUNTER — Other Ambulatory Visit: Payer: Self-pay

## 2023-01-18 MED ORDER — CARVEDILOL 12.5 MG PO TABS
12.5000 mg | ORAL_TABLET | Freq: Two times a day (BID) | ORAL | 4 refills | Status: DC
  Filled 2023-01-18: qty 60, 30d supply, fill #0
  Filled 2023-03-23: qty 60, 30d supply, fill #1

## 2023-03-10 ENCOUNTER — Other Ambulatory Visit: Payer: Self-pay

## 2023-03-26 ENCOUNTER — Telehealth: Payer: Self-pay

## 2023-03-26 DIAGNOSIS — R079 Chest pain, unspecified: Secondary | ICD-10-CM

## 2023-03-26 LAB — TROPONIN T: Troponin T (Highly Sensitive): <6 ng/L (ref 0–14)

## 2023-03-26 NOTE — Telephone Encounter (Signed)
Complaining of chest pain. Troponin stat ordered.

## 2023-03-29 ENCOUNTER — Other Ambulatory Visit (HOSPITAL_BASED_OUTPATIENT_CLINIC_OR_DEPARTMENT_OTHER): Payer: Self-pay

## 2023-03-29 ENCOUNTER — Other Ambulatory Visit: Payer: Self-pay

## 2023-03-29 ENCOUNTER — Ambulatory Visit: Payer: Commercial Managed Care - PPO | Attending: Cardiovascular Disease | Admitting: Cardiovascular Disease

## 2023-03-29 ENCOUNTER — Encounter: Payer: Self-pay | Admitting: Cardiovascular Disease

## 2023-03-29 VITALS — BP 136/84 | HR 72 | Ht 71.0 in | Wt 192.6 lb

## 2023-03-29 DIAGNOSIS — I493 Ventricular premature depolarization: Secondary | ICD-10-CM

## 2023-03-29 DIAGNOSIS — I10 Essential (primary) hypertension: Secondary | ICD-10-CM

## 2023-03-29 DIAGNOSIS — R072 Precordial pain: Secondary | ICD-10-CM

## 2023-03-29 DIAGNOSIS — R7303 Prediabetes: Secondary | ICD-10-CM

## 2023-03-29 DIAGNOSIS — E039 Hypothyroidism, unspecified: Secondary | ICD-10-CM | POA: Diagnosis not present

## 2023-03-29 DIAGNOSIS — E782 Mixed hyperlipidemia: Secondary | ICD-10-CM | POA: Diagnosis not present

## 2023-03-29 MED ORDER — CARVEDILOL 3.125 MG PO TABS
3.1250 mg | ORAL_TABLET | Freq: Two times a day (BID) | ORAL | 3 refills | Status: DC
  Filled 2023-03-29: qty 180, 90d supply, fill #0
  Filled 2023-06-28: qty 180, 90d supply, fill #1
  Filled 2023-09-07: qty 60, 30d supply, fill #0
  Filled 2023-09-27: qty 180, 90d supply, fill #1
  Filled 2023-11-05: qty 120, 60d supply, fill #2

## 2023-03-29 MED ORDER — METOPROLOL TARTRATE 100 MG PO TABS
100.0000 mg | ORAL_TABLET | Freq: Once | ORAL | 0 refills | Status: DC
  Filled 2023-03-29: qty 1, 1d supply, fill #0

## 2023-03-29 MED ORDER — DILTIAZEM HCL 30 MG PO TABS
30.0000 mg | ORAL_TABLET | Freq: Three times a day (TID) | ORAL | 3 refills | Status: DC
  Filled 2023-03-29: qty 270, 90d supply, fill #0

## 2023-03-29 NOTE — Progress Notes (Signed)
Cardiology Office Note:  .   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  The palpitations began bothering her since January.  They occur randomly and unpredictably.  They are not associated with physical activity, although they can occur while she is walking or exercising.  They are associated with a sensation of "choking" and sometimes dizziness.  On one occasion last Thursday , the palpitations occurred very frequently, made her feel extremely dizzy and she had to lay down (this happened  while walking in a very warm weather).  Because of these unpleasant sensations she has been avoiding exercise.  Otherwise, exercise itself does not produce dyspnea or chest discomfort.  She has never experienced full-blown syncope.  She denies lower extremity edema, claudication or focal neurological complaints.  She had not had any visual changes.  She sometimes has "heartburn" that is clearly meal related.  The palpitations were worse at the beginning of the year when she was under additional emotional strain doing preparations for her son's wedding.  Less emotional distress has been accompanied by a reduction in the burden of palpitations.  She drinks 1 cup of tea and 1 cup of coffee daily.  She rarely drinks alcohol.  She does not have symptoms of hypersomnolence.  Treatment with carvedilol at a dose of 6.25 mg twice daily has led to improvement in the palpitations, but not completely relief of the symptoms.  She was intolerant to low-dose of 12.5 mg daily due to extreme fatigue.  Previously on treatment with propranolol, subsequently metoprolol succinate, which were less effective and also did not provide sufficient blood  pressure control.  She has never smoked.  Most recent lipid profile from 11/30/2022 show very mild hypertriglyceridemia and a borderline HDL cholesterol of any 41 (by my calculation LDL around 106).  Older labs from 2022, I think when she was taking atorvastatin 20 mg once daily showed her LDL was 132 (direct measurement), total cholesterol 206, triglycerides 217, HDL 48 she had surgical menopause at age 60 when she underwent total abdominal hysterectomy.  She does not have diabetes mellitus.  The most recent labs were performed on atorvastatin 80 mg daily.  There is a family history of coronary disease, but not had early age (her mother had bypass surgery at age 6, died of breast cancer at age 1.  Her father had chronic lung disease related to smoking.  She wore an event monitor recently that identified her palpitations as being related to PVCs.  These occurred at a rate of about 3%.  They correlated with her symptoms.  She did not have any significant bradycardia and there was normal circadian rhythm variation.  There was no atrial fibrillation.  The monitor did document 2 very brief episodes of atrial tachycardia lasting only 4 and 7 beats respectively that were not associated with symptoms.  She had an echocardiogram last October that showed essentially normal findings.  There was no evidence of chamber hypertrophy or dilation, LVEF was 60 to 65%, there were no significant valvular abnormalities.  The right ventricle was also described as normal in size and function.  Images are not available for review.  She is originally from [place], [place].  Her husband Jackie Washington is a  cardiologist in our practice.  ROS: As described above  Studies Reviewed: [name]    ECG performed last week is personally reviewed and shows sinus rhythm with a couple of PVCs that appear to have LBBB morphology and inferior axis (unfortunately unable to catch the PVCs in the septal leads, so not entirely sure that they have RVOT  origin).  The arrhythmia monitor is reviewed.  It shows occasional (3% burden) PVCs that appear to be monomorphic.  There is also evidence of 2 very brief episodes of atrial tachycardia that are likely incidental and were not symptomatic.  There is no evidence of bradycardia, atrial fibrillation or ventricular tachycardia.  E[facility] report [facility]/08/2022 from [facility]  11/30/2022 cholesterol 176, HDL 41, triglycerides 230, hemoglobin 13.4, creatinine 0.8, ALT 36, TSH 2.66  04/23/2021 cholesterol 206, direct LDL 132, triglycerides 217, HDL 48 Risk Assessment/Calculations:             Physical Exam:   VS:  BP 136/84 (BP Location: Left Arm, Patient Position: Sitting, Cuff Size: Large)   Pulse 72   Ht 5\' 11"  (1.803 m)   Wt 192 lb 9.6 oz (87.4 kg)   SpO2 97%   BMI 26.86 kg/m    Wt Readings from Last 3 Encounters:  03/29/23 192 lb 9.6 oz (87.4 kg)  06/22/19 186 lb (84.4 kg)  05/11/19 186 lb 2 oz (84.4 kg)    GEN: Well nourished, well developed in no acute distress NECK: No JVD; No carotid bruits CARDIAC: RRR, no murmurs, rubs, gallops RESPIRATORY:  Clear to auscultation without rales, wheezing or rhonchi  ABDOMEN: Soft, non-tender, non-distended EXTREMITIES:  No edema; No deformity   ASSESSMENT AND PLAN: .    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  She also had 1 episode of chest discomfort and near syncope that occurred during physical activity and increased frequency of palpitations.  1. Precordial pain   2. PVCs (premature ventricular contractions)   3. Essential hypertension   4. Mixed hyperlipidemia   5. Prediabetes   6. Acquired hypothyroidism      1.  Chest discomfort: The event last week, albeit atypical, raises concern for CAD.  She does have a few risk factors including surgical menopause (albeit not particularly early), hypertension, borderline HDL cholesterol and hypertriglyceridemia.  Recommend coronary CT angiogram.  Labs for renal function checked today. 2.  PVCs:  These appear to be monomorphic.  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  Some features on ECG suggest a may have outflow tract origin and might be calcium channel blocker sensitive.  Will decrease the carvedilol to 3.125 mg twice daily and add diltiazem 30 mg 3 times a day, with a plan to titrate the diltiazem up if it provides relief.  There is a small risk of AV node block with a combination of beta-blocker and calcium channel blocker but will try to eventually transition to just 1 of these agents that appears to provide the most relief.  Asked her to call us promptly if she develops significant bradycardia or syncope.  The suspicion for a more complex arrhythmogenic disorder such as ARVC is low considering age, ethnic background, lack of family history.  If ventricular arrhythmia appears to be more complex, consider cardiac MRI. 3..HTN: Blood pressure control is suboptimal.  She reports that typically her systolic blood pressures run around 140 or slightly higher.  She is on maximum dose losartan and maximum tolerated dose of carvedilol.  Adding a calcium channel  blocker may help with this as well. 4.  HLP: Unfortunately, the lipid profile with mild elevated triglycerides, low HDL cholesterol and mildly elevated LDL is suggestive of insulin resistance and disadvantageous atherogenic profile with small dense LDL.  Before deciding whether or not to augment lipid-lowering therapy, will wait for the results of the CT angiogram and the associated calcium score.  She is only mildly overweight.  The less than expected reduction in LDL cholesterol on maximum dose atorvastatin raises possibility of LP(a) at high levels.  Next time she has a lipid profile will check this.  Once we clarify the coronary status and improve the palpitations, we will focus on increased physical activity. 5.  PreDM: Labs from 2019-2021 show a hemoglobin A1c of 6.0-6.1%.  This  confirms the impression that she is prone to insulin resistance. 6.  Hypothyroidism: Appears euthyroid both clinically and based on the most recent TSH from March on the current dose of levothyroxine supplementation.       Dispo: Will touch base by telephone later this week regarding the impact of diltiazem on her blood pressure and palpitations.  Scheduled for CT angio and follow-up in clinic after that.  Signed, Thurmon Fair, MD

## 2023-03-29 NOTE — Patient Instructions (Addendum)
Medication Instructions:  Your physician has recommended you make the following change in your medication:  1) START taking Cardizem (diltiazem) 30 mg three times daily  2) DECREASE carvedilol to 1/2 tablet (3.125 mg) twice daily   *If you need a refill on your cardiac medications before your next appointment, please call your pharmacy*  Take your blood pressure daily for the next week and call us with your readings.   Lab Work: TODAY: BMET   If you have labs (blood work) drawn today and your tests are completely normal, you will receive your results only by: [employer] (if you have MyChart) OR A paper copy in the mail If you have any lab test that is abnormal or we need to change your treatment, we will call you to review the results.  Testing/Procedures: Your physician has requested that you have cardiac CT. Cardiac computed tomography (CT) is a painless test that uses an x-ray machine to take clear, detailed pictures of your heart. For further information please visit [email or web address] Please follow instruction sheet as given.  Follow-Up: At [facility], you and your health needs are our priority.  As part of our continuing mission to provide you with exceptional heart care, we have created designated Provider Care Teams.  These Care Teams include your primary Cardiologist (physician) and Advanced Practice Providers (APPs -  Physician Assistants and Nurse Practitioners) who all work together to provide you with the care you need, when you need it.  Your next appointment:     Provider:   Thurmon Fair, MD     Other Instructions   Your cardiac CT will be scheduled at one of the below locations:   [facility] [address] [place], [place] [address] [phone]  If scheduled at [facility], please arrive at the [facility] and Children's Entrance (Entrance C2) of [facility] 30 minutes prior to test start time. You can use the  FREE valet parking offered at entrance C (encouraged to control the heart rate for the test)  Proceed to the [facility] Radiology Department (first floor) to check-in and test prep.  All radiology patients and guests should use entrance C2 at [facility], accessed from [facility], even though the hospital's physical address listed is [address]      Please follow these instructions carefully (unless otherwise directed):  On the Night Before the Test: Be sure to Drink plenty of water. Do not consume any caffeinated/decaffeinated beverages or chocolate 12 hours prior to your test. Do not take any antihistamines 12 hours prior to your test.  On the Day of the Test: Drink plenty of water until 1 hour prior to the test. Do not eat any food 1 hour prior to test. You may take your regular medications prior to the test other than those listed below. Take metoprolol (Lopressor) two hours prior to test (do not take diltiazem or carvedilol on the morning of your test). FEMALES- please wear underwire-free bra if available, avoid dresses & tight clothing  After the Test: Drink plenty of water. After receiving IV contrast, you may experience a mild flushed feeling. This is normal. On occasion, you may experience a mild rash up to 24 hours after the test. This is not dangerous. If this occurs, you can take Benadryl 25 mg and increase your fluid intake. If you experience trouble breathing, this can be serious. If it is severe call 911 IMMEDIATELY. If it is mild, please  call our office. If you take any of these medications: Glipizide/Metformin, Avandament, Glucavance, please do not take 48 hours after completing test unless otherwise instructed.  We will call to schedule your test 2-4 weeks out understanding that some insurance companies will need an authorization prior to the service being performed.   For more information and frequently asked questions, please visit  our website : [email or web address]  For non-scheduling related questions, please contact the cardiac imaging nurse navigator should you have any questions/concerns: Cardiac Imaging Nurse Navigators Direct Office Dial: [phone]   For scheduling needs, including cancellations and rescheduling, please call [place], [phone].

## 2023-03-29 NOTE — Addendum Note (Signed)
Addended by: Thurmon Fair on: 03/29/2023 03:57 PM   Modules accepted: Orders

## 2023-03-30 LAB — BASIC METABOLIC PANEL
BUN/Creatinine Ratio: 19 (ref 12–28)
BUN: 18 mg/dL (ref 8–27)
CO2: 24 mmol/L (ref 20–29)
Calcium: 9.8 mg/dL (ref 8.7–10.3)
Chloride: 103 mmol/L (ref 96–106)
Creatinine, Ser: 0.94 mg/dL (ref 0.57–1.00)
Glucose: 96 mg/dL (ref 70–99)
Potassium: 4.6 mmol/L (ref 3.5–5.2)
Sodium: 142 mmol/L (ref 134–144)
eGFR: 69 mL/min/{1.73_m2} (ref >59)

## 2023-04-06 ENCOUNTER — Telehealth (HOSPITAL_COMMUNITY): Payer: Self-pay | Admitting: *Deleted

## 2023-04-06 NOTE — Telephone Encounter (Signed)
Reaching out to patient to offer assistance regarding upcoming cardiac imaging study; pt verbalizes understanding of appt date/time, parking situation and where to check in, pre-test NPO status and medications ordered, and verified current allergies; name and call back number provided for further questions should they arise  Larey Brick RN Navigator Cardiac Imaging [facility] [phone] office [phone] cell  Patient to hold her daily carvedilol and cardizem and take 100mg  metoprolol tartrate two hours prior to her cardiac CT scan.  She is aware to arrive at 1:30 PM.

## 2023-04-07 ENCOUNTER — Telehealth: Payer: Self-pay | Admitting: Emergency Medicine

## 2023-04-07 ENCOUNTER — Other Ambulatory Visit (HOSPITAL_BASED_OUTPATIENT_CLINIC_OR_DEPARTMENT_OTHER): Payer: Self-pay

## 2023-04-07 ENCOUNTER — Encounter: Payer: Self-pay | Admitting: Cardiovascular Disease

## 2023-04-07 ENCOUNTER — Ambulatory Visit (HOSPITAL_COMMUNITY)
Admission: RE | Admit: 2023-04-07 | Discharge: 2023-04-07 | Disposition: A | Discharge status: Home / Self Care | Payer: Commercial Managed Care - PPO | Source: Ambulatory Visit | Attending: Cardiovascular Disease | Admitting: Cardiovascular Disease

## 2023-04-07 DIAGNOSIS — R072 Precordial pain: Secondary | ICD-10-CM | POA: Diagnosis not present

## 2023-04-07 MED ORDER — LOSARTAN POTASSIUM 25 MG PO TABS
25.0000 mg | ORAL_TABLET | Freq: Every day | ORAL | 3 refills | Status: DC
  Filled 2023-04-07: qty 90, 90d supply, fill #0
  Filled 2023-07-03: qty 90, 90d supply, fill #1

## 2023-04-07 MED ORDER — IOHEXOL 350 MG/ML SOLN
95.0000 mL | Freq: Once | INTRAVENOUS | Status: AC | PRN
  Administered 2023-04-07: 95 mL via INTRAVENOUS

## 2023-04-07 MED ORDER — DILTIAZEM HCL ER 120 MG PO CP12
120.0000 mg | ORAL_CAPSULE | Freq: Every evening | ORAL | 3 refills | Status: DC
  Filled 2023-04-07: qty 90, 90d supply, fill #0

## 2023-04-07 MED ORDER — NITROGLYCERIN 0.4 MG SL SUBL
SUBLINGUAL_TABLET | SUBLINGUAL | Status: AC
  Filled 2023-04-07: qty 2

## 2023-04-07 MED ORDER — NITROGLYCERIN 0.4 MG SL SUBL
0.8000 mg | SUBLINGUAL_TABLET | Freq: Once | SUBLINGUAL | Status: AC
  Administered 2023-04-07: 0.8 mg via SUBLINGUAL

## 2023-04-07 NOTE — Telephone Encounter (Signed)
Croitoru, Mihai, MD  You19 minutes ago (5:21 PM)    Let's change the diltiazem to sustained release 120 mg once daily at bedtime please and losartan 25 mg daily please    Called the patient and informed of the medication changes, she agreed. RX sent to her preferred pharmacy

## 2023-04-07 NOTE — Progress Notes (Signed)
Patient tolerated CT well. Vital signs stable encourage to drink water throughout day.Reasons explained and verbalized understanding. Ambulated steady gait.   

## 2023-04-07 NOTE — Addendum Note (Signed)
Addended by: Scheryl Marten on: 04/07/2023 05:49 PM   Modules accepted: Orders

## 2023-04-07 NOTE — Telephone Encounter (Signed)
Let's change the diltiazem to sustained release 120 mg once daily at bedtime please and losartan 25 mg daily please

## 2023-04-07 NOTE — Telephone Encounter (Signed)
Washington, Jackie Hora, MD  Jackie Marten, RN Mostly good news: there is no evidence of any serious coronary narrowing. However, the important finding is that you have more plaque than expected for age and gender (88th percentile). That changes our goals for your cholesterol level to an LDL under 70. I would add ezetimibe 10 mg daily and  recheck lipid profile and lipoprotein LP (a) in 3 months. 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 We should recheck that in about 12 months to make sure it is not changing rapidly. The most important intervention to prevent further dilation is good BP control. Losartan is probably the best choice to use for BP reduction in this scenario. There is a very tiny atrial septal defect that is of no clinical significance. Is the diltiazem helping with the palpitations? Is it OK if I send the prescription in for the ezetimibe? Jackie Washington already got a quick verbal report on the CT results   The patient states that she is fine with taking the Ezetimibe.  The diltiazem is helping "some" but is not like, "wow" (could be better)- She notices that when she walks her dogs outside, in the heat, she has palpitations. But states that she does not have them with "all activity." She says she is currently taking Losartan 25 mg daily because her BP was getting too low around lunch time: 98/70, 100/69; So she just started the 25 mg daily dose 3-4 days ago. She would like Losartan 25 mg prescription sent in too.

## 2023-04-08 ENCOUNTER — Other Ambulatory Visit (HOSPITAL_BASED_OUTPATIENT_CLINIC_OR_DEPARTMENT_OTHER): Payer: Self-pay

## 2023-04-08 ENCOUNTER — Other Ambulatory Visit: Payer: Self-pay

## 2023-04-08 MED ORDER — DILTIAZEM HCL ER COATED BEADS 120 MG PO CP24
120.0000 mg | ORAL_CAPSULE | Freq: Every day | ORAL | 3 refills | Status: DC
  Filled 2023-04-08: qty 90, 90d supply, fill #0
  Filled 2023-07-03: qty 90, 90d supply, fill #1
  Filled 2023-09-27: qty 90, 90d supply, fill #0

## 2023-04-08 NOTE — Telephone Encounter (Signed)
Verified with Dr Royann Shivers which Diltiazem is the one the patient needs. Diltiazem SR discontinued and Diltiazem (Cardizem CD) 120 mg 24hr capsule ordered- sent to pharmacy.

## 2023-04-08 NOTE — Addendum Note (Signed)
Addended by: Scheryl Marten on: 04/08/2023 08:24 AM   Modules accepted: Orders

## 2023-04-09 ENCOUNTER — Encounter: Payer: Self-pay | Admitting: Cardiovascular Disease

## 2023-04-09 ENCOUNTER — Ambulatory Visit: Payer: Commercial Managed Care - PPO | Attending: Cardiovascular Disease | Admitting: Cardiovascular Disease

## 2023-04-09 ENCOUNTER — Other Ambulatory Visit (HOSPITAL_BASED_OUTPATIENT_CLINIC_OR_DEPARTMENT_OTHER): Payer: Self-pay

## 2023-04-09 VITALS — BP 126/82 | HR 75 | Ht 71.0 in | Wt 195.2 lb

## 2023-04-09 DIAGNOSIS — E039 Hypothyroidism, unspecified: Secondary | ICD-10-CM | POA: Diagnosis not present

## 2023-04-09 DIAGNOSIS — I77810 Thoracic aortic ectasia: Secondary | ICD-10-CM

## 2023-04-09 DIAGNOSIS — R7303 Prediabetes: Secondary | ICD-10-CM

## 2023-04-09 DIAGNOSIS — E782 Mixed hyperlipidemia: Secondary | ICD-10-CM

## 2023-04-09 DIAGNOSIS — R931 Abnormal findings on diagnostic imaging of heart and coronary circulation: Secondary | ICD-10-CM | POA: Diagnosis not present

## 2023-04-09 DIAGNOSIS — I493 Ventricular premature depolarization: Secondary | ICD-10-CM | POA: Diagnosis not present

## 2023-04-09 MED ORDER — EZETIMIBE 10 MG PO TABS
10.0000 mg | ORAL_TABLET | Freq: Every day | ORAL | 3 refills | Status: DC
  Filled 2023-04-09: qty 90, 90d supply, fill #0
  Filled 2023-07-03: qty 90, 90d supply, fill #1
  Filled 2023-11-26: qty 90, 90d supply, fill #0
  Filled 2024-02-23: qty 90, 90d supply, fill #1

## 2023-04-09 MED ORDER — EZETIMIBE 10 MG PO TABS
10.0000 mg | ORAL_TABLET | Freq: Every day | ORAL | 0 refills | Status: DC
  Filled 2023-04-09: qty 90, 90d supply, fill #0

## 2023-04-09 NOTE — Progress Notes (Unsigned)
Cardiology Office Note:  .   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  The palpitations began bothering her since January.  They occur randomly and unpredictably.  They are not associated with physical activity, although they can occur while she is walking or exercising.  They are associated with a sensation of "choking" and sometimes dizziness.  On one occasion last Thursday , the palpitations occurred very frequently, made her feel extremely dizzy and she had to lay down (this happened  while walking in a very warm weather).  Because of these unpleasant sensations she has been avoiding exercise.  Otherwise, exercise itself does not produce dyspnea or chest discomfort.  She has never experienced full-blown syncope.  She denies lower extremity edema, claudication or focal neurological complaints.  She had not had any visual changes.  She sometimes has "heartburn" that is clearly meal related.  The palpitations were worse at the beginning of the year when she was under additional emotional strain doing preparations for her son's wedding.  Less emotional distress has been accompanied by a reduction in the burden of palpitations.  She drinks 1 cup of tea and 1 cup of coffee daily.  She rarely drinks alcohol.  She does not have symptoms of hypersomnolence.  Treatment with carvedilol at a dose of 6.25 mg twice daily has led to improvement in the palpitations, but not completely relief of the symptoms.  She was intolerant to low-dose of 12.5 mg daily due to extreme fatigue.  Previously on treatment with propranolol, subsequently metoprolol succinate, which were less effective and also did not provide sufficient blood  pressure control.  She has never smoked.  Most recent lipid profile from 11/30/2022 show very mild hypertriglyceridemia and a borderline HDL cholesterol of any 41 (by my calculation LDL around 106).  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  She does not have diabetes mellitus.  The most recent labs were performed on atorvastatin 80 mg daily.  There is a family history of coronary disease, but not had early age (her mother had bypass surgery at age 51, died of breast cancer at age 13.  Her father had chronic lung disease related to smoking.  She wore an event monitor recently that identified her palpitations as being related to PVCs.  These occurred at a rate of about 3%.  They correlated with her symptoms.  She did not have any significant bradycardia and there was normal circadian rhythm variation.  There was no atrial fibrillation.  The monitor did document 2 very brief episodes of atrial tachycardia lasting only 4 and 7 beats respectively that were not associated with symptoms.  She had an echocardiogram last October that showed essentially normal findings.  There was no evidence of chamber hypertrophy or dilation, LVEF was 60 to 65%, there were no significant valvular abnormalities.  The right ventricle was also described as normal in size and function.  Images are not available for review.  She is originally from [place], [place].  Her husband [name] is a  cardiologist in our practice.  ROS: As described above  Studies Reviewed: Jackie Washington    ECG performed last week is personally reviewed and shows sinus rhythm with a couple of PVCs that appear to have LBBB morphology and inferior axis (unfortunately unable to catch the PVCs in the septal leads, so not entirely sure that they have RVOT  origin).  The arrhythmia monitor is reviewed.  It shows occasional (3% burden) PVCs that appear to be monomorphic.  There is also evidence of 2 very brief episodes of atrial tachycardia that are likely incidental and were not symptomatic.  There is no evidence of bradycardia, atrial fibrillation or ventricular tachycardia.  Echo report 06/08/2022 from [facility]  11/30/2022 cholesterol 176, HDL 41, triglycerides 230, hemoglobin 13.4, creatinine 0.8, ALT 36, TSH 2.66  04/23/2021 cholesterol 206, direct LDL 132, triglycerides 217, HDL 48 Risk Assessment/Calculations:             Physical Exam:   VS:  BP 126/82 (BP Location: Left Arm, Patient Position: Sitting, Cuff Size: Large)   Pulse 75   Ht 5\' 11"  (1.803 m)   Wt 195 lb 3.2 oz (88.5 kg)   SpO2 96%   BMI 27.22 kg/m    Wt Readings from Last 3 Encounters:  04/09/23 195 lb 3.2 oz (88.5 kg)  03/29/23 192 lb 9.6 oz (87.4 kg)  06/22/19 186 lb (84.4 kg)    GEN: Well nourished, well developed in no acute distress NECK: No JVD; No carotid bruits CARDIAC: RRR, no murmurs, rubs, gallops RESPIRATORY:  Clear to auscultation without rales, wheezing or rhonchi  ABDOMEN: Soft, non-tender, non-distended EXTREMITIES:  No edema; No deformity   ASSESSMENT AND PLAN: .    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  She also had 1 episode of chest discomfort and near syncope that occurred during physical activity and increased frequency of palpitations.  No diagnosis found.    1.  Chest discomfort: The event last week, albeit atypical, raises concern for CAD.  She does have a few risk factors including surgical menopause (albeit not particularly early), hypertension, borderline HDL cholesterol and hypertriglyceridemia.  Recommend coronary CT angiogram.  Labs for renal function checked today. 2.  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  Some  features on ECG suggest a may have outflow tract origin and might be calcium channel blocker sensitive.  Will decrease the carvedilol to 3.125 mg twice daily and add diltiazem 30 mg 3 times a day, with a plan to titrate the diltiazem up if it provides relief.  There is a small risk of AV node block with a combination of beta-blocker and calcium channel blocker but will try to eventually transition to just 1 of these agents that appears to provide the most relief.  Asked her to call us promptly if she develops significant bradycardia or syncope.  The suspicion for a more complex arrhythmogenic disorder such as ARVC is low considering age, ethnic background, lack of family history.  If ventricular arrhythmia appears to be more complex, consider cardiac MRI. 3..HTN: Blood pressure control is suboptimal.  She reports that typically her systolic blood pressures run around 140 or slightly higher.  She is on maximum dose losartan and maximum tolerated dose of carvedilol.  Adding a calcium channel blocker may help with this as well. 4.  HLP: Unfortunately, the lipid profile with mild elevated triglycerides, low HDL cholesterol and mildly elevated LDL is suggestive of  insulin resistance and disadvantageous atherogenic profile with small dense LDL.  Before deciding whether or not to augment lipid-lowering therapy, will wait for the results of the CT angiogram and the associated calcium score.  She is only mildly overweight.  The less than expected reduction in LDL cholesterol on maximum dose atorvastatin raises possibility of LP(a) at high levels.  Next time she has a lipid profile will check this.  Once we clarify the coronary status and improve the palpitations, we will focus on increased physical activity. 5.  PreDM: Labs from 2019-2021 show a hemoglobin A1c of 6.0-6.1%.  This confirms the impression that she is prone to insulin resistance. 6.  Hypothyroidism: Appears euthyroid both clinically and based on the most  recent TSH from March on the current dose of levothyroxine supplementation.       Dispo: Will touch base by telephone later this week regarding the impact of diltiazem on her blood pressure and palpitations.  Scheduled for CT angio and follow-up in clinic after that.  Signed, Thurmon Fair, MD

## 2023-04-09 NOTE — Patient Instructions (Signed)
Medication Instructions:  Added Zetia 10 mg daily *If you need a refill on your cardiac medications before your next appointment, please call your pharmacy*   Lab Work: Lipid panel- Fasting lab work before your Follow up appointment If you have labs (blood work) drawn today and your tests are completely normal, you will receive your results only by: MyChart Message (if you have MyChart) OR A paper copy in the mail If you have any lab test that is abnormal or we need to change your treatment, we will call you to review the results.  Follow-Up: At [facility], you and your health needs are our priority.  As part of our continuing mission to provide you with exceptional heart care, we have created designated Provider Care Teams.  These Care Teams include your primary Cardiologist (physician) and Advanced Practice Providers (APPs -  Physician Assistants and Nurse Practitioners) who all work together to provide you with the care you need, when you need it.  We recommend signing up for the patient portal called "MyChart".  Sign up information is provided on this After Visit Summary.  MyChart is used to connect with patients for Virtual Visits (Telemedicine).  Patients are able to view lab/test results, encounter notes, upcoming appointments, etc.  Non-urgent messages can be sent to your provider as well.   To learn more about what you can do with MyChart, go to [email or web address].    Your next appointment:    November/December  Provider:   Thurmon Fair, MD

## 2023-04-10 DIAGNOSIS — I77810 Thoracic aortic ectasia: Secondary | ICD-10-CM | POA: Insufficient documentation

## 2023-04-10 DIAGNOSIS — R931 Abnormal findings on diagnostic imaging of heart and coronary circulation: Secondary | ICD-10-CM | POA: Insufficient documentation

## 2023-04-14 ENCOUNTER — Other Ambulatory Visit (HOSPITAL_COMMUNITY): Payer: Commercial Managed Care - PPO

## 2023-05-22 ENCOUNTER — Encounter: Payer: Self-pay | Admitting: Cardiovascular Disease

## 2023-05-22 IMAGING — MG MM DIGITAL SCREENING BILAT W/ TOMO AND CAD
6 of 10 series · 6 of 30 positions shown · non-contrast
Comparison: Previous exam(s).

CLINICAL DATA: Screening.

EXAM:
DIGITAL SCREENING BILATERAL MAMMOGRAM WITH TOMOSYNTHESIS AND CAD
TECHNIQUE: Bilateral screening digital craniocaudal and mediolateral oblique
mammograms were obtained. Bilateral screening digital breast
tomosynthesis was performed. The images were evaluated with
computer-aided detection.

[R MLO synth-2D (1 of 2)]
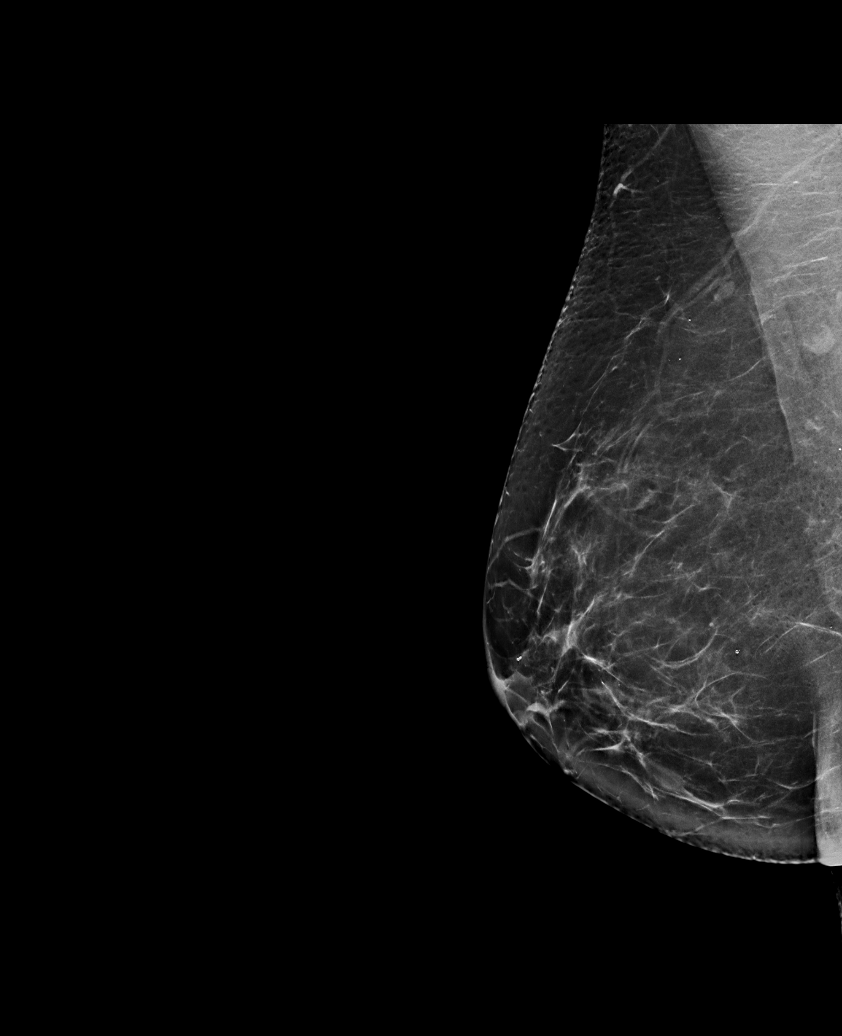

[L CC synth-2D]
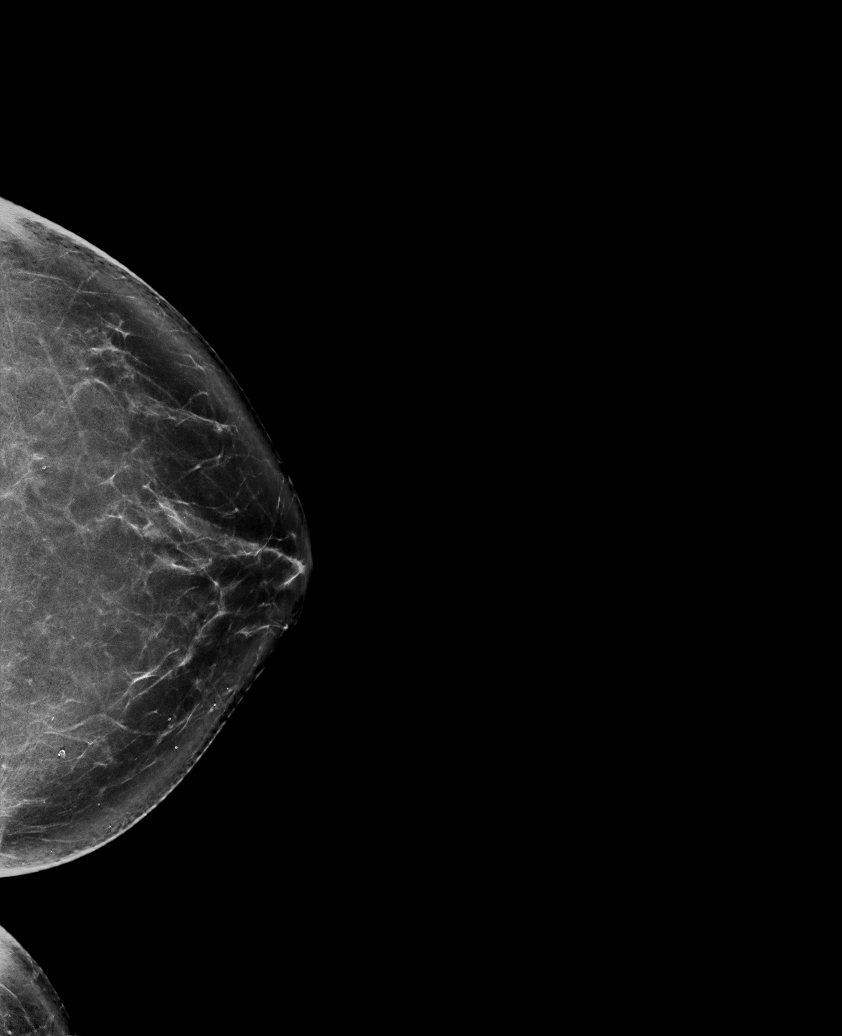

[R CC synth-2D]
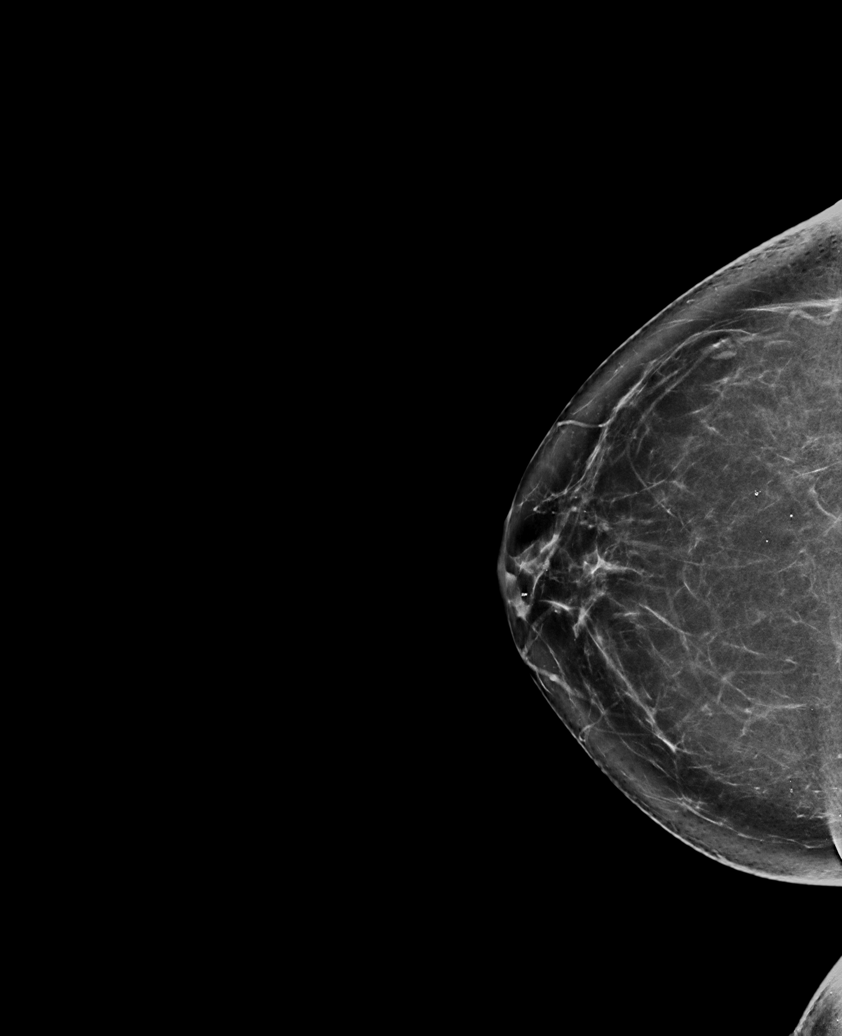

[L MLO synth-2D]
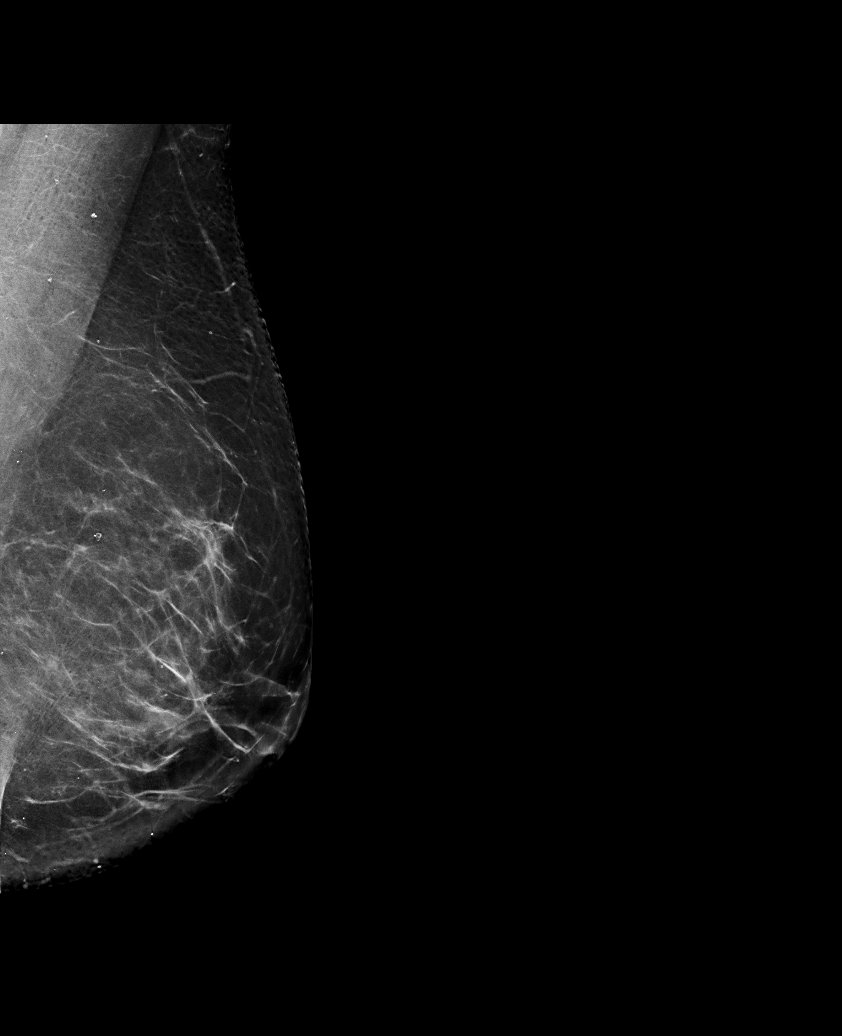

[R MLO synth-2D (2 of 2)]
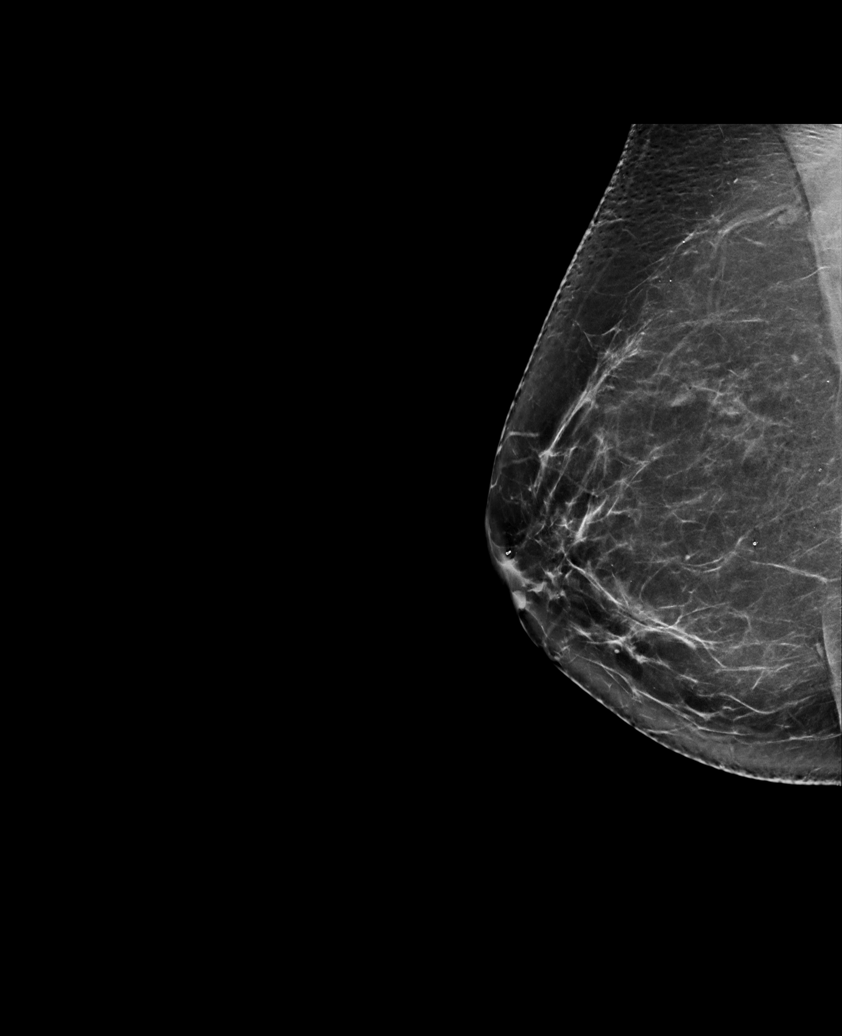

[R CC tomo · tomo slice 41/80.0]
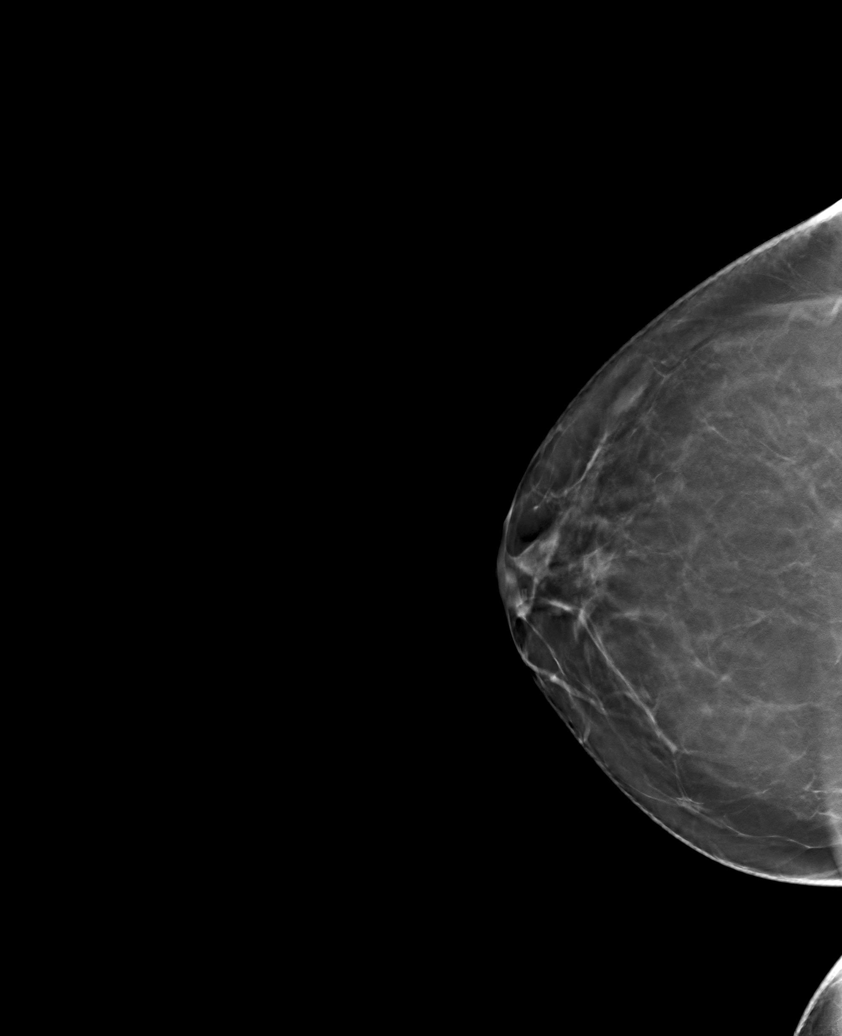

[6 of 30 positions shown; findings below may reference images not displayed]

ACR Breast Density Category b: There are scattered areas of
fibroglandular density.
FINDINGS: There are no findings suspicious for malignancy.
IMPRESSION: No mammographic evidence of malignancy. A result letter of this
screening mammogram will be mailed directly to the patient.

RECOMMENDATION:
Screening mammogram in one year. (Code:51-O-LD2)

BI-RADS CATEGORY  1: Negative.

## 2023-05-31 ENCOUNTER — Other Ambulatory Visit: Payer: Self-pay

## 2023-06-01 ENCOUNTER — Other Ambulatory Visit (HOSPITAL_BASED_OUTPATIENT_CLINIC_OR_DEPARTMENT_OTHER): Payer: Self-pay

## 2023-06-01 MED ORDER — LEVOTHYROXINE SODIUM 75 MCG PO TABS
75.0000 ug | ORAL_TABLET | Freq: Every day | ORAL | 0 refills | Status: DC
  Filled 2023-06-01: qty 90, 90d supply, fill #0

## 2023-06-02 DIAGNOSIS — E782 Mixed hyperlipidemia: Secondary | ICD-10-CM | POA: Diagnosis not present

## 2023-06-03 ENCOUNTER — Other Ambulatory Visit (HOSPITAL_BASED_OUTPATIENT_CLINIC_OR_DEPARTMENT_OTHER): Payer: Self-pay

## 2023-06-03 LAB — LIPID PANEL
Chol/HDL Ratio: 3 ratio (ref 0.0–4.4)
Cholesterol, Total: 139 mg/dL (ref 100–199)
HDL: 46 mg/dL (ref >39)
LDL Chol Calc (NIH): 71 mg/dL (ref 0–99)
Triglycerides: 122 mg/dL (ref 0–149)
VLDL Cholesterol Cal: 22 mg/dL (ref 5–40)

## 2023-06-04 NOTE — Progress Notes (Signed)
Have a wonderful trip!

## 2023-06-28 ENCOUNTER — Other Ambulatory Visit (HOSPITAL_BASED_OUTPATIENT_CLINIC_OR_DEPARTMENT_OTHER): Payer: Self-pay

## 2023-06-28 MED ORDER — ATORVASTATIN CALCIUM 80 MG PO TABS
80.0000 mg | ORAL_TABLET | Freq: Every day | ORAL | 1 refills | Status: DC
  Filled 2023-06-28 – 2023-09-27 (×2): qty 90, 90d supply, fill #0

## 2023-07-06 DIAGNOSIS — M7502 Adhesive capsulitis of left shoulder: Secondary | ICD-10-CM | POA: Diagnosis not present

## 2023-07-21 NOTE — Therapy (Signed)
OUTPATIENT PHYSICAL THERAPY SHOULDER EVALUATION   Patient Name: Jackie Washington MRN: [id number] DOB:08-13-1961, 62 y.o., female Today's Date: 07/26/2023   END OF SESSION:  PT End of Session - 07/26/23 0846     Visit Number 1    Date for PT Re-Evaluation 09/20/23    Authorization Type [employer] Inch    PT Start Time [phone]    PT Stop Time 0932    PT Time Calculation (min) 46 min    Activity Tolerance Patient tolerated treatment well    Behavior During Therapy WFL for tasks assessed/performed             Past Medical History:  Diagnosis Date   Fatigue    Goiter diffuse, nontoxic    History of basal cell carcinoma    Hyperlipidemia    Hypertension    Iron deficiency anemia    Prediabetes    Vaginal neoplasm benign    Vitamin D deficiency    Past Surgical History:  Procedure Laterality Date   ABDOMINAL HYSTERECTOMY     APPENDECTOMY     BREAST CYST ASPIRATION     8 years ago unsure of laterality    KNEE SURGERY     Patient Active Problem List   Diagnosis Date Noted   Mild dilation of ascending aorta (HCC) 04/10/2023   Elevated coronary artery calcium score 04/10/2023   Prediabetes 03/29/2023   Mixed hyperlipidemia 03/29/2023   Essential hypertension 03/29/2023   PVCs (premature ventricular contractions) 03/29/2023   Acquired hypothyroidism 03/29/2023    PCP: Marylen Ponto, MD   REFERRING PROVIDER: Linda Hedges, MD   REFERRING DIAG: M75.02 (ICD-10-CM) - Adhesive capsulitis of left shoulder   THERAPY DIAG:  Acute pain of both shoulders  Abnormal posture  Muscle weakness (generalized)  RATIONALE FOR EVALUATION AND TREATMENT: Rehabilitation  ONSET DATE: end of August 2024  NEXT MD VISIT: 6-8 weeks from 07/06/23 or PRN   SUBJECTIVE:                                                                                                                                                                                                         SUBJECTIVE  STATEMENT: Pt reports issues with B shoulders but states the L was worse. Pain originated after swimming throwing a baseball in the pool in August.  She was given a cortisone injection in the L shoulder (glenohumeral joint injection on 07/06/23) and reports 85% improvement. Patient describes anterior lateral shoulder pain worse with overhead activity and reaching backwards.  Still has difficulty lifting heavy items like groceries,  putting a bra on, reaching for the seat belt, and finding a comfortable sleeping position. Also has difficulty with jerking motions like starting the leaf blower. 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  PAIN: Are you having pain? Yes: NPRS scale: 0/10 rest, with certain motion/activities 2/10 Pain location: L anterior upper arm/shoulder radiating into chest at times Pain description: short sharp pain with activities like pulling the blower, dull with reaching to put a bra on or pull the seatbelt Aggravating factors: lifting heavy items like groceries, putting a bra on, reaching for the seat belt, jerking motions like starting the leaf blower Relieving factors: rest, cease the triggering activity  Are you having pain? Yes: NPRS scale: 0/10 at rest, up to 3/10 Pain location: R anterior upper arm/shoulder radiating into chest at times Pain description: short sharp pain with activities like pulling the blower, dull with reaching to put a bra on or pull the seatbelt Aggravating factors: lifting heavy items like groceries, putting a bra on, reaching for the seat belt, jerking motions like starting the leaf blower Relieving factors: rest, cease the triggering activity  PERTINENT HISTORY:  HTN, fatigue, prediabetes, hypothyroidism, knee surgery, basal cell carcinoma, HLD  PRECAUTIONS: None  RED FLAGS: None  HAND DOMINANCE: Right  WEIGHT BEARING RESTRICTIONS: No  FALLS:  Has patient fallen in last 6 months? No  LIVING ENVIRONMENT: Lives with:  lives with their spouse Lives in: House/apartment  OCCUPATION: Home maker  PLOF: Independent and Leisure: swimming, walking the dogs, reading  PATIENT GOALS: "To lessen the pain with daily activities (and ADLs)."   OBJECTIVE: (objective measures completed at initial evaluation unless otherwise dated)  DIAGNOSTIC FINDINGS:  None available  PATIENT SURVEYS:  Quick Dash 20.5 / 100 = 20.5 %  COGNITION: Overall cognitive status: Within functional limits for tasks assessed     SENSATION: WFL  POSTURE: rounded shoulders and forward head  UPPER EXTREMITY ROM:  B shoulder PROM WNL with some discomfort noted with L shoulder flexion & ER at end ROM  Active ROM Right eval Left eval  Shoulder flexion 153 * 142 *  Shoulder extension 24 * 38 *  Shoulder abduction 132 * 125 *  Shoulder adduction    Shoulder internal rotation FIR to L1* FIR to L1*  Shoulder external rotation FER to upper scapula* FER to upper scapula*  Elbow flexion    Elbow extension    Wrist flexion    Wrist extension    Wrist ulnar deviation    Wrist radial deviation    Wrist pronation    Wrist supination    (Blank rows = not tested, * - discomfort)  UPPER EXTREMITY MMT:  MMT Right eval Left eval  Shoulder flexion 4- ^ 3+   Shoulder extension 4 ^ 4  Shoulder abduction 4- ^ 3+ ^  Shoulder adduction    Shoulder internal rotation 4 ^ 4+   Shoulder external rotation 4 ^ 4+ *  Middle trapezius    Lower trapezius    Elbow flexion    Elbow extension    Wrist flexion    Wrist extension    Wrist ulnar deviation    Wrist radial deviation    Wrist pronation    Wrist supination    Grip strength (lbs)    (Blank rows = not tested, ^ = pain, * - discomfort)  SHOULDER SPECIAL TESTS: Impingement tests: Neer impingement test: negative and Hawkins/Kennedy impingement test: positive  on L Rotator cuff assessment: Empty can test: negative  and Full can test: negative  JOINT MOBILITY TESTING:  WFL  PALPATION:   TTP over R pec minor    TODAY'S TREATMENT:   07/26/23 - Eval THERAPEUTIC EXERCISE: to improve flexibility, strength and mobility.  Demonstration, verbal and tactile cues throughout for technique.  Single arm doorway pec stretch at ~60 ABD x 30" bil - 1 rep each with arm straight and elbow bent - pt preferring the former B scap retraction & depression 10 x 5" Standing RTB scap retraction + B shoulder row 10 x 5" Standing RTB scap retraction + B shoulder extension 10 x 5"   PATIENT EDUCATION:  Education details: PT eval findings, progress with PT, and ongoing PT POC  Person educated: Patient Education method: Explanation, Demonstration, Verbal cues, and Handouts Education comprehension: verbalized understanding, returned demonstration, verbal cues required, and needs further education  HOME EXERCISE PROGRAM: Access Code: [email or web address] URL: [email or web address] Date: 07/26/2023 Prepared by: Glenetta Hew  Exercises - Doorway Pec Stretch at 60 Degrees Abduction with Arm Straight  - 2-3 x daily - 7 x weekly - 3 reps - 30 sec hold - Single Arm Doorway Pec Stretch at 60 Elevation  - 2-3 x daily - 7 x weekly - 3 reps - 30 sec hold - Seated Scapular Retraction  - 2 x daily - 7 x weekly - 2 sets - 10 reps - 3-5 sec hold - Scapular Retraction with Resistance Advanced  - 1 x daily - 7 x weekly - 2 sets - 10 reps - 5 sec hold - Standing Bilateral Low Shoulder Row with Anchored Resistance  - 1 x daily - 7 x weekly - 2 sets - 10 reps - 5 sec hold   ASSESSMENT:  CLINICAL IMPRESSION: Jackie Washington is a 62 y.o. female who was referred to physical therapy for evaluation and treatment for adhesive capsulitis in B shoulders, L>R.  She reports onset of pain in August 2024 after swimming and throwing a ball in the pool, but states the MD told her that her frozen shoulders were idiopathic in origin.  She received a cortisone injection the L GH joint with 85% improvement noted but still  experiences increased pain with overhead activity and reaching backwards creating difficulty lifting heavy items like groceries, putting a bra on, reaching for the seat belt, and finding a comfortable sleeping position as well as with jerking motions like starting the leaf blower.  Current deficits include abnormal posture, TTP in R pec minor, limited and painful/discomforting B shoulder ROM and B shoulder and scapular weakness.  Porscha "Claris Che" will benefit from skilled PT to address above deficits to improve mobility and activity tolerance with decreased pain interference.   OBJECTIVE IMPAIRMENTS: decreased activity tolerance, decreased knowledge of condition, decreased ROM, decreased strength, hypomobility, increased fascial restrictions, impaired perceived functional ability, increased muscle spasms, impaired flexibility, impaired UE functional use, improper body mechanics, postural dysfunction, and pain.   ACTIVITY LIMITATIONS: carrying, lifting, sleeping, bathing, dressing, reach over head, hygiene/grooming, and caring for others  PARTICIPATION LIMITATIONS: meal prep, cleaning, laundry, shopping, community activity, and yard work  PERSONAL FACTORS: Past/current experiences, Time since onset of injury/illness/exacerbation, and 3+ comorbidities: HTN, fatigue, prediabetes, hypothyroidism, knee surgery, basal cell carcinoma, HLD  are also affecting patient's functional outcome.   REHAB POTENTIAL: Good  CLINICAL DECISION MAKING: Evolving/moderate complexity  EVALUATION COMPLEXITY: Moderate   GOALS: Goals reviewed with patient? Yes  SHORT TERM GOALS: Target date: 08/23/2023   Patient will be independent with initial HEP to improve outcomes and carryover.  Baseline:  Goal status: INITIAL  LONG TERM GOALS: Target date: 09/20/2023  Patient will be independent with ongoing/advanced HEP for self-management at home.  Baseline:  Goal status: INITIAL  2.  Patient will report 50-75%  improvement in B shoulder pain to improve QOL.  Baseline: L shoulder up to 2/10 since injection, R shoulder up to 3/10 Goal status: INITIAL  3.  Patient to demonstrate improved upright posture with posterior shoulder girdle engaged to promote improved glenohumeral joint mobility. Baseline: fwd head and rounded shoulder posture Goal status: INITIAL  4.  Patient to improve B shoulder AROM to WFL/WNL without pain provocation to allow for increased ease of ADLs.  Baseline: Refer to above UE ROM table Goal status: INITIAL  5.  Patient will demonstrate improved B shoulder strength to >/= 4 to 4+/5 for functional UE use. Baseline: Refer to above UE MMT table Goal status: INITIAL  6  Patient will report </= 11% on QuickDASH to demonstrate improved functional ability.  Baseline: 20.5 / 100 = 20.5 % Goal status: INITIAL  7.  Patient will report no sleep disturbance due to B shoulder pain.   Baseline: difficulty finding a comfortable sleeping position d/t B shoulder pain Goal status: INITIAL   8. Patient to report ability to perform ADLs, household, and leisure activities without limitation due to increased shoulder pain, LOM or weakness. Baseline: difficulty lifting heavy items like groceries, putting a bra on, reaching for the seat belt, and with jerking motions like starting the leaf blower Goal status: INITIAL   PLAN:  PT FREQUENCY: 2x/week  PT DURATION: 8 weeks  PLANNED INTERVENTIONS: 97164- PT Re-evaluation, 97110-Therapeutic exercises, 97530- Therapeutic activity, 97112- Neuromuscular re-education, 97535- Self Care, 09811- Manual therapy, 97014- Electrical stimulation (unattended), [id number]- Electrical stimulation (manual), 97016- Vasopneumatic device, [id number]- Ultrasound, 91478- Ionotophoresis 4mg /ml Dexamethasone, Patient/Family education, Taping, Dry Needling, Joint mobilization, Spinal mobilization, Cryotherapy, and Moist heat  PLAN FOR NEXT SESSION: Review initial HEP; progress  postural stretching and scapular strengthening/stabilization, adding RTC strengthening as tolerated;  MT +/- DN as indicated to address abnormal muscle tension/tightness and pain    Marry Guan, PT 07/26/2023, 1:38 PM

## 2023-07-26 ENCOUNTER — Ambulatory Visit: Payer: Commercial Managed Care - PPO | Attending: Orthopedic Surgery | Admitting: Physical Therapy

## 2023-07-26 ENCOUNTER — Other Ambulatory Visit (HOSPITAL_BASED_OUTPATIENT_CLINIC_OR_DEPARTMENT_OTHER): Payer: Self-pay

## 2023-07-26 ENCOUNTER — Other Ambulatory Visit: Payer: Self-pay

## 2023-07-26 ENCOUNTER — Encounter: Payer: Self-pay | Admitting: Physical Therapy

## 2023-07-26 DIAGNOSIS — M25512 Pain in left shoulder: Secondary | ICD-10-CM | POA: Diagnosis not present

## 2023-07-26 DIAGNOSIS — M25511 Pain in right shoulder: Secondary | ICD-10-CM | POA: Insufficient documentation

## 2023-07-26 DIAGNOSIS — R293 Abnormal posture: Secondary | ICD-10-CM | POA: Diagnosis not present

## 2023-07-26 DIAGNOSIS — M6281 Muscle weakness (generalized): Secondary | ICD-10-CM | POA: Insufficient documentation

## 2023-07-26 MED ORDER — CYCLOBENZAPRINE HCL 10 MG PO TABS
10.0000 mg | ORAL_TABLET | Freq: Three times a day (TID) | ORAL | 0 refills | Status: DC | PRN
  Filled 2023-07-26 (×2): qty 60, 20d supply, fill #0

## 2023-07-26 MED ORDER — CYCLOBENZAPRINE HCL 10 MG PO TABS
10.0000 mg | ORAL_TABLET | Freq: Three times a day (TID) | ORAL | 0 refills | Status: DC | PRN
  Filled 2023-07-26: qty 60, 20d supply, fill #0

## 2023-08-02 ENCOUNTER — Ambulatory Visit: Payer: Commercial Managed Care - PPO

## 2023-08-04 ENCOUNTER — Ambulatory Visit: Payer: Commercial Managed Care - PPO | Attending: Cardiovascular Disease | Admitting: Cardiovascular Disease

## 2023-08-04 ENCOUNTER — Encounter: Payer: Self-pay | Admitting: Cardiovascular Disease

## 2023-08-04 ENCOUNTER — Other Ambulatory Visit (HOSPITAL_BASED_OUTPATIENT_CLINIC_OR_DEPARTMENT_OTHER): Payer: Self-pay

## 2023-08-04 VITALS — BP 122/80 | HR 74 | Ht 71.0 in | Wt 192.0 lb

## 2023-08-04 DIAGNOSIS — E782 Mixed hyperlipidemia: Secondary | ICD-10-CM | POA: Diagnosis not present

## 2023-08-04 DIAGNOSIS — I10 Essential (primary) hypertension: Secondary | ICD-10-CM

## 2023-08-04 DIAGNOSIS — R7303 Prediabetes: Secondary | ICD-10-CM | POA: Diagnosis not present

## 2023-08-04 DIAGNOSIS — E039 Hypothyroidism, unspecified: Secondary | ICD-10-CM | POA: Diagnosis not present

## 2023-08-04 DIAGNOSIS — I712 Thoracic aortic aneurysm, without rupture, unspecified: Secondary | ICD-10-CM | POA: Diagnosis not present

## 2023-08-04 DIAGNOSIS — R931 Abnormal findings on diagnostic imaging of heart and coronary circulation: Secondary | ICD-10-CM

## 2023-08-04 DIAGNOSIS — I493 Ventricular premature depolarization: Secondary | ICD-10-CM | POA: Diagnosis not present

## 2023-08-04 MED ORDER — IRBESARTAN 75 MG PO TABS
75.0000 mg | ORAL_TABLET | Freq: Every day | ORAL | 3 refills | Status: DC
  Filled 2023-08-04: qty 90, 90d supply, fill #0
  Filled 2023-11-05: qty 90, 90d supply, fill #1

## 2023-08-04 NOTE — Progress Notes (Signed)
Cardiology Office Note:  .   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    The combination of low-dose carvedilol and diltiazem seems to be effective in helping with her palpitations (beta-blockers alone were not effective at suppressing the palpitations and caused fatigue at higher doses).  She would say that they are "80% better".  She has not had problems of shortness of breath or chest pain at rest with activity and has not had any dizziness or syncope.  Denies lower extremity edema.  She does not have symptoms related to her high blood pressure, but has noticed that consistently her blood pressure is elevated in the evening, often with a diastolic blood pressure around 90 mmHg.  Treatment with carvedilol at a dose of 6.25 mg twice daily has led to improvement in the palpitations, but not completely relief of the symptoms.  She was intolerant to low-dose of 12.5 mg daily due to extreme fatigue.  Previously on treatment with propranolol, subsequently metoprolol succinate, which were less effective and also did not provide sufficient blood pressure control.  She has never smoked.  Most recent lipid profile from 11/30/2022 show very mild hypertriglyceridemia and a borderline HDL cholesterol of any 41 (by my calculation LDL around 106).  Older labs from 2022, I think when she was taking atorvastatin 20 mg once daily showed her LDL was 132 (direct  measurement), total cholesterol 206, triglycerides 217, HDL 48 she had surgical menopause at age 17 when she underwent total abdominal hysterectomy.  She does not have diabetes mellitus.  The most recent labs were performed on atorvastatin 80 mg daily.  There is a family history of coronary disease, but not had early age (her mother had bypass surgery at age 31, died of breast cancer at age 21.  Her father had chronic lung disease related to smoking.  She wore an event monitor recently that identified her palpitations as being related to PVCs.  These occurred at a rate of about 3%.  They correlated with her symptoms.  She did not have any significant bradycardia and there was normal circadian rhythm variation.  There was no atrial fibrillation.  The monitor did document 2 very brief episodes of atrial tachycardia lasting only 4 and 7 beats respectively that were not associated with symptoms.  She had an echocardiogram last October that showed essentially normal findings.  There was no evidence of chamber hypertrophy or dilation, LVEF was 60 to 65%, there were no significant valvular abnormalities.  The right ventricle was also described as normal in size and function.  Images are not available for review.  She is originally from [place], [place].  Her husband [name] is a cardiologist in our practice.  ROS: As described above  Studies Reviewed: [name]    ECG performed last week is personally reviewed and shows sinus rhythm with a couple of PVCs that appear to have LBBB morphology and inferior axis (unfortunately unable to catch the PVCs in the septal leads, so not entirely sure  that they have RVOT origin).  The arrhythmia monitor is reviewed.  It shows occasional (3% burden) PVCs that appear to be monomorphic.  There is also evidence of 2 very brief episodes of atrial tachycardia that are likely incidental and were not symptomatic.  There is no evidence of bradycardia, atrial fibrillation or ventricular  tachycardia.  Echo report 06/08/2022 from [facility]  11/30/2022 cholesterol 176, HDL 41, triglycerides 230, hemoglobin 13.4, creatinine 0.8, ALT 36, TSH 2.66  Lipid Panel     Component Value Date/Time   CHOL 139 06/02/2023 0808   TRIG 122 06/02/2023 0808   HDL 46 06/02/2023 0808   CHOLHDL 3.0 06/02/2023 0808   LDLCALC 71 06/02/2023 0808   LABVLDL 22 06/02/2023 0808     04/23/2021 cholesterol 206, direct LDL 132, triglycerides 217, HDL 48   Risk Assessment/Calculations:             Physical Exam:   VS:  BP 122/80 (BP Location: Left Arm, Patient Position: Sitting, Cuff Size: Large)   Pulse 74   Ht 5\' 11"  (1.803 m)   Wt 192 lb (87.1 kg)   SpO2 96%   BMI 26.78 kg/m    Wt Readings from Last 3 Encounters:  08/04/23 192 lb (87.1 kg)  04/09/23 195 lb 3.2 oz (88.5 kg)  03/29/23 192 lb 9.6 oz (87.4 kg)     General: Alert, oriented x3, no distress, appears lean and fit. Head: no evidence of trauma, PERRL, EOMI, no exophtalmos or lid lag, no myxedema, no xanthelasma; normal ears, nose and oropharynx Neck: normal jugular venous pulsations and no hepatojugular reflux; brisk carotid pulses without delay and no carotid bruits Chest: clear to auscultation, no signs of consolidation by percussion or palpation, normal fremitus, symmetrical and full respiratory excursions Cardiovascular: normal position and quality of the apical impulse, regular rhythm, normal first and second heart sounds, no murmurs, rubs or gallops Abdomen: no tenderness or distention, no masses by palpation, no abnormal pulsatility or arterial bruits, normal bowel sounds, no hepatosplenomegaly Extremities: no clubbing, cyanosis or edema; 2+ radial, ulnar and brachial pulses bilaterally; 2+ right femoral, posterior tibial and dorsalis pedis pulses; 2+ left femoral, posterior tibial and dorsalis pedis pulses; no subclavian or femoral bruits Neurological: grossly nonfocal Psych: Normal mood and  affect   ASSESSMENT AND PLAN: .      1. Thoracic aortic aneurysm without rupture, unspecified part (HCC)   2. Elevated coronary artery calcium score   3. PVCs (premature ventricular contractions)   4. Essential hypertension   5. Mixed hyperlipidemia   6. Prediabetes   7. Acquired hypothyroidism      Elevated coronary calcium score: She does not have angina pectoris even with greater than usual physical activity.  Most of the atherosclerotic plaque was seen in the proximal-mid right coronary artery with stenoses less than 49%.  The focus is on risk factor modification. PVCs: Largely monomorphic, roughly 3% of all beats, possibly RVOT origin.  Reasonable palliation of complaint on combination therapy with diltiazem and carvedilol.  She has not had bradycardia or dizziness with this combination.  Low level of suspicion for thrombogenic cardiomyopathy. HTN: Generally well-controlled, but seems that the effects of losartan are waning towards the end of the day.  Will switch to a longer acting ARB like irbesartan 75 mg daily. Dilation of the ascending aorta: Recheck in July 2025.  By CT angiogram was 41 mm diameter.  No family history of aortic aneurysm or or of unexplained sudden death.  ARB and  beta-blocker combo is a good choice to prevent aneurysm expansion.   HLP: The follow-up lipid profile after adding ezetimibe shows marked improvement with LDL cholesterol at around 70, better HDL and normal triglycerides.  Continue atorvastatin plus ezetimibe. PreDM: Labs from 2019-2021 show a hemoglobin A1c of 6.0-6.1%.  Avoid excessive carbohydrates and increase physical exercise.  Avoid gaining weight.  Follow-up hemoglobin A1c with PCP. Hypothyroidism: On levothyroxine supplementation.  She is clinically euthyroid, normal recent TSH.       Dispo: Stop losartan and start irbesartan 75 mg once daily.  Please send a log of blood pressure in approximately weeks time.  Signed, Thurmon Fair, MD

## 2023-08-04 NOTE — Patient Instructions (Addendum)
Medication Instructions:  STOP LOSARTAN START IRBESARTAN 75 MG DAILY *If you need a refill on your cardiac medications before your next appointment, please call your pharmacy*  Testing/Procedures: CTA- Aorta ordered. Needed July 2025. Someone from [facility] will call to get this scheduled. You will need some lab work right before this procedure (BMP) the order has been placed, just walk in for lab work.    Follow-Up: At [facility], you and your health needs are our priority.  As part of our continuing mission to provide you with exceptional heart care, we have created designated Provider Care Teams.  These Care Teams include your primary Cardiologist (physician) and Advanced Practice Providers (APPs -  Physician Assistants and Nurse Practitioners) who all work together to provide you with the care you need, when you need it.  We recommend signing up for the patient portal called "MyChart".  Sign up information is provided on this After Visit Summary.  MyChart is used to connect with patients for Virtual Visits (Telemedicine).  Patients are able to view lab/test results, encounter notes, upcoming appointments, etc.  Non-urgent messages can be sent to your provider as well.   To learn more about what you can do with MyChart, go to [email or web address].    Your next appointment:   1 year(s)  Provider:   Thurmon Fair, MD

## 2023-08-06 ENCOUNTER — Other Ambulatory Visit (HOSPITAL_BASED_OUTPATIENT_CLINIC_OR_DEPARTMENT_OTHER): Payer: Self-pay

## 2023-08-10 ENCOUNTER — Ambulatory Visit: Payer: Commercial Managed Care - PPO | Attending: Orthopedic Surgery

## 2023-08-10 ENCOUNTER — Ambulatory Visit: Payer: Commercial Managed Care - PPO | Admitting: Cardiovascular Disease

## 2023-08-10 DIAGNOSIS — M25512 Pain in left shoulder: Secondary | ICD-10-CM | POA: Insufficient documentation

## 2023-08-10 DIAGNOSIS — M6281 Muscle weakness (generalized): Secondary | ICD-10-CM | POA: Diagnosis not present

## 2023-08-10 DIAGNOSIS — M25511 Pain in right shoulder: Secondary | ICD-10-CM | POA: Diagnosis not present

## 2023-08-10 DIAGNOSIS — R293 Abnormal posture: Secondary | ICD-10-CM | POA: Insufficient documentation

## 2023-08-10 NOTE — Therapy (Signed)
OUTPATIENT PHYSICAL THERAPY SHOULDER EVALUATION   Patient Name: Jackie Washington MRN: [id number] DOB:[birth year], 62 y.o., female Today's Date: 08/10/2023   END OF SESSION:    Past Medical History:  Diagnosis Date   Fatigue    Goiter diffuse, nontoxic    History of basal cell carcinoma    Hyperlipidemia    Hypertension    Iron deficiency anemia    Prediabetes    Vaginal neoplasm benign    Vitamin D deficiency    Past Surgical History:  Procedure Laterality Date   ABDOMINAL HYSTERECTOMY     APPENDECTOMY     BREAST CYST ASPIRATION     8 years ago unsure of laterality    KNEE SURGERY     Patient Active Problem List   Diagnosis Date Noted   Mild dilation of ascending aorta (HCC) 04/10/2023   Elevated coronary artery calcium score 04/10/2023   Prediabetes 03/29/2023   Mixed hyperlipidemia 03/29/2023   Essential hypertension 03/29/2023   PVCs (premature ventricular contractions) 03/29/2023   Acquired hypothyroidism 03/29/2023    PCP: Marylen Ponto, MD   REFERRING PROVIDER: Linda Hedges, MD   REFERRING DIAG: M75.02 (ICD-10-CM) - Adhesive capsulitis of left shoulder   THERAPY DIAG:  Acute pain of both shoulders  Abnormal posture  Muscle weakness (generalized)  RATIONALE FOR EVALUATION AND TREATMENT: Rehabilitation  ONSET DATE: end of August 2024  NEXT MD VISIT: 6-8 weeks from 07/06/23 or PRN   SUBJECTIVE:                                                                                                                                                                                                         SUBJECTIVE STATEMENT: Pt reports that her R shoulder is tender, she was sick last week with stomach bug so hadn't done any exercises.  PAIN: Are you having pain? Yes: NPRS scale: 0/10 rest, with certain motion/activities 2/10 Pain location: L anterior upper arm/shoulder radiating into chest at times Pain description: short sharp pain with activities  like pulling the blower, dull with reaching to put a bra on or pull the seatbelt Aggravating factors: lifting heavy items like groceries, putting a bra on, reaching for the seat belt, jerking motions like starting the leaf blower Relieving factors: rest, cease the triggering activity  Are you having pain? Yes: NPRS scale: 1/10 at rest, up to 3/10 Pain location: R anterior upper arm/shoulder radiating into chest at times Pain description: short sharp pain with activities like pulling the blower, dull with reaching to put a bra on or pull the seatbelt  Aggravating factors: lifting heavy items like groceries, putting a bra on, reaching for the seat belt, jerking motions like starting the leaf blower Relieving factors: rest, cease the triggering activity  PERTINENT HISTORY:  HTN, fatigue, prediabetes, hypothyroidism, knee surgery, basal cell carcinoma, HLD  PRECAUTIONS: None  RED FLAGS: None  HAND DOMINANCE: Right  WEIGHT BEARING RESTRICTIONS: No  FALLS:  Has patient fallen in last 6 months? No  LIVING ENVIRONMENT: Lives with: lives with their spouse Lives in: House/apartment  OCCUPATION: Home maker  PLOF: Independent and Leisure: swimming, walking the dogs, reading  PATIENT GOALS: "To lessen the pain with daily activities (and ADLs)."   OBJECTIVE: (objective measures completed at initial evaluation unless otherwise dated)  DIAGNOSTIC FINDINGS:  None available  PATIENT SURVEYS:  Quick Dash 20.5 / 100 = 20.5 %  COGNITION: Overall cognitive status: Within functional limits for tasks assessed     SENSATION: WFL  POSTURE: rounded shoulders and forward head  UPPER EXTREMITY ROM:  B shoulder PROM WNL with some discomfort noted with L shoulder flexion & ER at end ROM  Active ROM Right eval Left eval  Shoulder flexion 153 * 142 *  Shoulder extension 24 * 38 *  Shoulder abduction 132 * 125 *  Shoulder adduction    Shoulder internal rotation FIR to L1* FIR to L1*   Shoulder external rotation FER to upper scapula* FER to upper scapula*  Elbow flexion    Elbow extension    Wrist flexion    Wrist extension    Wrist ulnar deviation    Wrist radial deviation    Wrist pronation    Wrist supination    (Blank rows = not tested, * - discomfort)  UPPER EXTREMITY MMT:  MMT Right eval Left eval  Shoulder flexion 4- ^ 3+   Shoulder extension 4 ^ 4  Shoulder abduction 4- ^ 3+ ^  Shoulder adduction    Shoulder internal rotation 4 ^ 4+   Shoulder external rotation 4 ^ 4+ *  Middle trapezius    Lower trapezius    Elbow flexion    Elbow extension    Wrist flexion    Wrist extension    Wrist ulnar deviation    Wrist radial deviation    Wrist pronation    Wrist supination    Grip strength (lbs)    (Blank rows = not tested, ^ = pain, * - discomfort)  SHOULDER SPECIAL TESTS: Impingement tests: Neer impingement test: negative and Hawkins/Kennedy impingement test: positive  on L Rotator cuff assessment: Empty can test: negative and Full can test: negative  JOINT MOBILITY TESTING:  WFL  PALPATION:  TTP over R pec minor    TODAY'S TREATMENT:  08/10/23 THERAPEUTIC EXERCISE: to improve flexibility, strength and mobility.  Demonstration, verbal and tactile cues throughout for technique.  UBE L1.0 x 3 min both ways Pec doorway stretch x 30 sec B low Standing GTB scap retraction + B shoulder row 10 x 5" Standing GTB scap retraction + B shoulder extension 10 x 5" ER and scap retraction RTBx 10  S/L L shoulder abduction x 10  S/L L shoulder ER x 10  Supine B shoulder flexion x 10   07/26/23 - Eval THERAPEUTIC EXERCISE: to improve flexibility, strength and mobility.  Demonstration, verbal and tactile cues throughout for technique.  Single arm doorway pec stretch at ~60 ABD x 30" bil - 1 rep each with arm straight and elbow bent - pt preferring the former B scap retraction & depression  10 x 5" Standing RTB scap retraction + B shoulder row 10 x  5" Standing RTB scap retraction + B shoulder extension 10 x 5"   PATIENT EDUCATION:  Education details: PT eval findings, progress with PT, and ongoing PT POC  Person educated: Patient Education method: Explanation, Demonstration, Verbal cues, and Handouts Education comprehension: verbalized understanding, returned demonstration, verbal cues required, and needs further education  HOME EXERCISE PROGRAM: Access Code: [email or web address] URL: [email or web address] Date: 08/10/2023 Prepared by: Verta Ellen  Exercises - Doorway Pec Stretch at 60 Degrees Abduction with Arm Straight  - 2-3 x daily - 7 x weekly - 3 reps - 30 sec hold - Single Arm Doorway Pec Stretch at 60 Elevation  - 2-3 x daily - 7 x weekly - 3 reps - 30 sec hold - Seated Scapular Retraction  - 2 x daily - 7 x weekly - 2 sets - 10 reps - 3-5 sec hold - Scapular Retraction with Resistance Advanced  - 1 x daily - 7 x weekly - 2 sets - 10 reps - 5 sec hold - Standing Bilateral Low Shoulder Row with Anchored Resistance  - 1 x daily - 7 x weekly - 2 sets - 10 reps - 5 sec hold - Shoulder External Rotation and Scapular Retraction with Resistance  - 1 x daily - 7 x weekly - 2 sets - 10 reps   ASSESSMENT:  CLINICAL IMPRESSION: Progressed patient through exercises to improve postural alignment for improved shoulder ROM. TC given to upwardly rotate scapula with supine shld flexion. She does require VC during session to keep her head from drooping forward. She would continue to benefit from postural correction through exercises as well as cuing to improve use of UE with less pain.  Kensley "Claris Che" will benefit from skilled PT to address above deficits to improve mobility and activity tolerance with decreased pain interference.   OBJECTIVE IMPAIRMENTS: decreased activity tolerance, decreased knowledge of condition, decreased ROM, decreased strength, hypomobility, increased fascial restrictions, impaired perceived functional  ability, increased muscle spasms, impaired flexibility, impaired UE functional use, improper body mechanics, postural dysfunction, and pain.   ACTIVITY LIMITATIONS: carrying, lifting, sleeping, bathing, dressing, reach over head, hygiene/grooming, and caring for others  PARTICIPATION LIMITATIONS: meal prep, cleaning, laundry, shopping, community activity, and yard work  PERSONAL FACTORS: Past/current experiences, Time since onset of injury/illness/exacerbation, and 3+ comorbidities: HTN, fatigue, prediabetes, hypothyroidism, knee surgery, basal cell carcinoma, HLD  are also affecting patient's functional outcome.   REHAB POTENTIAL: Good  CLINICAL DECISION MAKING: Evolving/moderate complexity  EVALUATION COMPLEXITY: Moderate   GOALS: Goals reviewed with patient? Yes  SHORT TERM GOALS: Target date: 08/23/2023   Patient will be independent with initial HEP to improve outcomes and carryover.  Baseline:  Goal status: IN PROGRESS  LONG TERM GOALS: Target date: 09/20/2023  Patient will be independent with ongoing/advanced HEP for self-management at home.  Baseline:  Goal status: IN PROGRESS  2.  Patient will report 50-75% improvement in B shoulder pain to improve QOL.  Baseline: L shoulder up to 2/10 since injection, R shoulder up to 3/10 Goal status: IN PROGRESS  3.  Patient to demonstrate improved upright posture with posterior shoulder girdle engaged to promote improved glenohumeral joint mobility. Baseline: fwd head and rounded shoulder posture Goal status: IN PROGRESS  4.  Patient to improve B shoulder AROM to WFL/WNL without pain provocation to allow for increased ease of ADLs.  Baseline: Refer to above UE ROM table Goal status: IN PROGRESS  5.  Patient  will demonstrate improved B shoulder strength to >/= 4 to 4+/5 for functional UE use. Baseline: Refer to above UE MMT table Goal status: IN PROGRESS  6  Patient will report </= 11% on QuickDASH to demonstrate improved  functional ability.  Baseline: 20.5 / 100 = 20.5 % Goal status: IN PROGRESS  7.  Patient will report no sleep disturbance due to B shoulder pain.   Baseline: difficulty finding a comfortable sleeping position d/t B shoulder pain Goal status: IN PROGRESS   8. Patient to report ability to perform ADLs, household, and leisure activities without limitation due to increased shoulder pain, LOM or weakness. Baseline: difficulty lifting heavy items like groceries, putting a bra on, reaching for the seat belt, and with jerking motions like starting the leaf blower Goal status: IN PROGRESS   PLAN:  PT FREQUENCY: 2x/week  PT DURATION: 8 weeks  PLANNED INTERVENTIONS: 97164- PT Re-evaluation, 97110-Therapeutic exercises, 97530- Therapeutic activity, 97112- Neuromuscular re-education, 97535- Self Care, [id number]- Manual therapy, 97014- Electrical stimulation (unattended), [id number]- Electrical stimulation (manual), 97016- Vasopneumatic device, [id number]- Ultrasound, 82956- Ionotophoresis 4mg /ml Dexamethasone, Patient/Family education, Taping, Dry Needling, Joint mobilization, Spinal mobilization, Cryotherapy, and Moist heat  PLAN FOR NEXT SESSION:  progress postural stretching and scapular strengthening/stabilization, adding RTC strengthening as tolerated;  MT +/- DN as indicated to address abnormal muscle tension/tightness and pain    Kierston Plasencia L Claudine Stallings, PTA 08/10/2023, 2:51 PM

## 2023-08-13 ENCOUNTER — Encounter: Payer: Self-pay | Admitting: Physical Therapy

## 2023-08-13 ENCOUNTER — Ambulatory Visit: Payer: Commercial Managed Care - PPO | Admitting: Physical Therapy

## 2023-08-13 DIAGNOSIS — M6281 Muscle weakness (generalized): Secondary | ICD-10-CM

## 2023-08-13 DIAGNOSIS — R293 Abnormal posture: Secondary | ICD-10-CM

## 2023-08-13 DIAGNOSIS — M25512 Pain in left shoulder: Secondary | ICD-10-CM | POA: Diagnosis not present

## 2023-08-13 DIAGNOSIS — M25511 Pain in right shoulder: Secondary | ICD-10-CM | POA: Diagnosis not present

## 2023-08-13 NOTE — Therapy (Signed)
OUTPATIENT PHYSICAL THERAPY TREATMENT   Patient Name: Jackie Washington MRN: [id number] DOB:[birth year], 62 y.o., female Today's Date: 08/13/2023   END OF SESSION:  PT End of Session - 08/13/23 1017     Visit Number 3    Date for PT Re-Evaluation 09/20/23    Authorization Type [employer] - [facility]    PT Start Time 1017    PT Stop Time 1101    PT Time Calculation (min) 44 min    Activity Tolerance Patient tolerated treatment well    Behavior During Therapy WFL for tasks assessed/performed              Past Medical History:  Diagnosis Date   Fatigue    Goiter diffuse, nontoxic    History of basal cell carcinoma    Hyperlipidemia    Hypertension    Iron deficiency anemia    Prediabetes    Vaginal neoplasm benign    Vitamin D deficiency    Past Surgical History:  Procedure Laterality Date   ABDOMINAL HYSTERECTOMY     APPENDECTOMY     BREAST CYST ASPIRATION     8 years ago unsure of laterality    KNEE SURGERY     Patient Active Problem List   Diagnosis Date Noted   Mild dilation of ascending aorta (HCC) 04/10/2023   Elevated coronary artery calcium score 04/10/2023   Prediabetes 03/29/2023   Mixed hyperlipidemia 03/29/2023   Essential hypertension 03/29/2023   PVCs (premature ventricular contractions) 03/29/2023   Acquired hypothyroidism 03/29/2023    PCP: Marylen Ponto, MD   REFERRING PROVIDER: Linda Hedges, MD   REFERRING DIAG: M75.02 (ICD-10-CM) - Adhesive capsulitis of left shoulder   THERAPY DIAG:  Acute pain of both shoulders  Abnormal posture  Muscle weakness (generalized)  RATIONALE FOR EVALUATION AND TREATMENT: Rehabilitation  ONSET DATE: end of August 2024  NEXT MD VISIT: 6-8 weeks from 07/06/23 or PRN   SUBJECTIVE:                                                                                                                                                                                                         SUBJECTIVE  STATEMENT: Pt reports she is feeling better. Mild pain in R shoulder but no pain on L.  PAIN: Are you having pain? Yes: NPRS scale: 0 /10 Pain location: L anterior upper arm/shoulder radiating into chest at times Pain description: short sharp pain with activities like pulling the blower, dull with reaching to put a bra on or pull the seatbelt Aggravating factors: lifting heavy items  like groceries, putting a bra on, reaching for the seat belt, jerking motions like starting the leaf blower Relieving factors: rest, cease the triggering activity  Are you having pain? Yes: NPRS scale: 2/10 Pain location: R anterior upper arm/shoulder radiating into chest at times Pain description: short sharp pain with activities like pulling the blower, dull with reaching to put a bra on or pull the seatbelt Aggravating factors: lifting heavy items like groceries, putting a bra on, reaching for the seat belt, jerking motions like starting the leaf blower Relieving factors: rest, cease the triggering activity  PERTINENT HISTORY:  HTN, fatigue, prediabetes, hypothyroidism, knee surgery, basal cell carcinoma, HLD  PRECAUTIONS: None  RED FLAGS: None  HAND DOMINANCE: Right  WEIGHT BEARING RESTRICTIONS: No  FALLS:  Has patient fallen in last 6 months? No  LIVING ENVIRONMENT: Lives with: lives with their spouse Lives in: House/apartment  OCCUPATION: Home maker  PLOF: Independent and Leisure: swimming, walking the dogs, reading  PATIENT GOALS: "To lessen the pain with daily activities (and ADLs)."   OBJECTIVE: (objective measures completed at initial evaluation unless otherwise dated)  DIAGNOSTIC FINDINGS:  None available  PATIENT SURVEYS:  Quick Dash 20.5 / 100 = 20.5 %  COGNITION: Overall cognitive status: Within functional limits for tasks assessed     SENSATION: WFL  POSTURE: rounded shoulders and forward head  UPPER EXTREMITY ROM:  B shoulder PROM WNL with some discomfort noted  with L shoulder flexion & ER at end ROM  Active ROM Right eval Left eval  Shoulder flexion 153 * 142 *  Shoulder extension 24 * 38 *  Shoulder abduction 132 * 125 *  Shoulder adduction    Shoulder internal rotation FIR to L1* FIR to L1*  Shoulder external rotation FER to upper scapula* FER to upper scapula*  Elbow flexion    Elbow extension    Wrist flexion    Wrist extension    Wrist ulnar deviation    Wrist radial deviation    Wrist pronation    Wrist supination    (Blank rows = not tested, * - discomfort)  UPPER EXTREMITY MMT:  MMT Right eval Left eval  Shoulder flexion 4- ^ 3+   Shoulder extension 4 ^ 4  Shoulder abduction 4- ^ 3+ ^  Shoulder adduction    Shoulder internal rotation 4 ^ 4+   Shoulder external rotation 4 ^ 4+ *  Middle trapezius    Lower trapezius    Elbow flexion    Elbow extension    Wrist flexion    Wrist extension    Wrist ulnar deviation    Wrist radial deviation    Wrist pronation    Wrist supination    Grip strength (lbs)    (Blank rows = not tested, ^ = pain, * - discomfort)  SHOULDER SPECIAL TESTS: Impingement tests: Neer impingement test: negative and Hawkins/Kennedy impingement test: positive  on L Rotator cuff assessment: Empty can test: negative and Full can test: negative  JOINT MOBILITY TESTING:  WFL  PALPATION:  TTP over R pec minor    TODAY'S TREATMENT:   08/13/23 THERAPEUTIC EXERCISE: to improve flexibility, strength and mobility.  Demonstration, verbal and tactile cues throughout for technique.  UBE L2.0 x 6 min (3' each fwd & back) Standing RTB scap retraction (into doorframe) + B shoulder ER 10 x 3" Standing RTB scap retraction (into doorframe) + B shoulder horiz ABD 10 x 3" Standing RTB scap retraction (into doorframe) + B shoulder horiz ABD diagonals  10 x 3" B lower trap setting - Y wall slide with looped YTB at wrists + slight lift off wall x 10 - cues to avoid pulling arms back past head Wall push-up plus x 10 B  serratus wall clocks x 10  MANUAL THERAPY: To promote normalized muscle tension, improved flexibility, and reduced pain. Skilled palpation and monitoring of soft tissue during DN Trigger Point Dry-Needling  Treatment instructions: Expect mild to moderate muscle soreness. S/S of pneumothorax if dry needled over a lung field, and to seek immediate medical attention should they occur. Patient verbalized understanding of these instructions and education. Patient Consent Given: Yes Education handout provided: Yes Muscles treated: R anterior & lateral deltoid, R UT Electrical stimulation performed: No Parameters: N/A Treatment response/outcome: Twitch Response Elicited and Palpable Increase in Muscle Length STM/DTM, manual TPR and pin & stretch to muscles addressed with DN   08/10/23 THERAPEUTIC EXERCISE: to improve flexibility, strength and mobility.  Demonstration, verbal and tactile cues throughout for technique.  UBE L1.0 x 3 min both ways Pec doorway stretch x 30 sec B low Standing GTB scap retraction + B shoulder row 10 x 5" Standing GTB scap retraction + B shoulder extension 10 x 5" ER and scap retraction RTBx 10  S/L L shoulder abduction x 10  S/L L shoulder ER x 10  Supine B shoulder flexion x 10    07/26/23 - Eval THERAPEUTIC EXERCISE: to improve flexibility, strength and mobility.  Demonstration, verbal and tactile cues throughout for technique.  Single arm doorway pec stretch at ~60 ABD x 30" bil - 1 rep each with arm straight and elbow bent - pt preferring the former B scap retraction & depression 10 x 5" Standing RTB scap retraction + B shoulder row 10 x 5" Standing RTB scap retraction + B shoulder extension 10 x 5"   PATIENT EDUCATION:  Education details: continue with current HEP, role of DN, and DN rational, procedure, outcomes, potential side effects, and recommended post-treatment exercises/activity  Person educated: Patient Education method: Explanation and  Handouts Education comprehension: verbalized understanding  HOME EXERCISE PROGRAM: Access Code: [email or web address] URL: [email or web address] Date: 08/10/2023 Prepared by: Verta Ellen  Exercises - Doorway Pec Stretch at 60 Degrees Abduction with Arm Straight  - 2-3 x daily - 7 x weekly - 3 reps - 30 sec hold - Single Arm Doorway Pec Stretch at 60 Elevation  - 2-3 x daily - 7 x weekly - 3 reps - 30 sec hold - Seated Scapular Retraction  - 2 x daily - 7 x weekly - 2 sets - 10 reps - 3-5 sec hold - Scapular Retraction with Resistance Advanced  - 1 x daily - 7 x weekly - 2 sets - 10 reps - 5 sec hold - Standing Bilateral Low Shoulder Row with Anchored Resistance  - 1 x daily - 7 x weekly - 2 sets - 10 reps - 5 sec hold - Shoulder External Rotation and Scapular Retraction with Resistance  - 1 x daily - 7 x weekly - 2 sets - 10 reps   ASSESSMENT:  CLINICAL IMPRESSION: [place] "Claris Che" reports slight increased pain/discomfort in her R shoulder with the HEP - cautioned her to avoid significant increase in pain and let PT know if this occurs.  Some increased muscle tension/taut bands identified in R anterolateral deltoids and UT which appeared amenable to DN.  After explanation of DN rational, procedures, outcomes and potential side effects, including precautions with DN over the lung fields, patient verbalized  consent to DN treatment in conjunction with manual STM/DTM and TPR to reduce ttp/muscle tension. Muscles treated as indicated above. DN produced normal response with good twitches elicited resulting in palpable reduction in pain/ttp and muscle tension. Pt educated to expect mild to moderate muscle soreness for up to 24-48 hrs and instructed to continue prescribed HEP and current activity level with pt verbalizing understanding of these instructions.  Reviewed and progressed shoulder strengthening with emphasis on scapular activation for improved glenohumeral kinematics.  Patient noting  mild soreness/discomfort with some exercises but no significant increased pain.  Claris Che will benefit from continued skilled PT to address ongoing deficits to improve mobility and activity tolerance with decreased pain interference.   OBJECTIVE IMPAIRMENTS: decreased activity tolerance, decreased knowledge of condition, decreased ROM, decreased strength, hypomobility, increased fascial restrictions, impaired perceived functional ability, increased muscle spasms, impaired flexibility, impaired UE functional use, improper body mechanics, postural dysfunction, and pain.   ACTIVITY LIMITATIONS: carrying, lifting, sleeping, bathing, dressing, reach over head, hygiene/grooming, and caring for others  PARTICIPATION LIMITATIONS: meal prep, cleaning, laundry, shopping, community activity, and yard work  PERSONAL FACTORS: Past/current experiences, Time since onset of injury/illness/exacerbation, and 3+ comorbidities: HTN, fatigue, prediabetes, hypothyroidism, knee surgery, basal cell carcinoma, HLD  are also affecting patient's functional outcome.   REHAB POTENTIAL: Good  CLINICAL DECISION MAKING: Evolving/moderate complexity  EVALUATION COMPLEXITY: Moderate   GOALS: Goals reviewed with patient? Yes  SHORT TERM GOALS: Target date: 08/23/2023   Patient will be independent with initial HEP to improve outcomes and carryover.  Baseline:  Goal status: IN PROGRESS  LONG TERM GOALS: Target date: 09/20/2023  Patient will be independent with ongoing/advanced HEP for self-management at home.  Baseline:  Goal status: IN PROGRESS  2.  Patient will report 50-75% improvement in B shoulder pain to improve QOL.  Baseline: L shoulder up to 2/10 since injection, R shoulder up to 3/10 Goal status: IN PROGRESS  3.  Patient to demonstrate improved upright posture with posterior shoulder girdle engaged to promote improved glenohumeral joint mobility. Baseline: fwd head and rounded shoulder posture Goal status:  IN PROGRESS  4.  Patient to improve B shoulder AROM to WFL/WNL without pain provocation to allow for increased ease of ADLs.  Baseline: Refer to above UE ROM table Goal status: IN PROGRESS  5.  Patient will demonstrate improved B shoulder strength to >/= 4 to 4+/5 for functional UE use. Baseline: Refer to above UE MMT table Goal status: IN PROGRESS  6  Patient will report </= 11% on QuickDASH to demonstrate improved functional ability.  Baseline: 20.5 / 100 = 20.5 % Goal status: IN PROGRESS  7.  Patient will report no sleep disturbance due to B shoulder pain.   Baseline: difficulty finding a comfortable sleeping position d/t B shoulder pain Goal status: IN PROGRESS   8. Patient to report ability to perform ADLs, household, and leisure activities without limitation due to increased shoulder pain, LOM or weakness. Baseline: difficulty lifting heavy items like groceries, putting a bra on, reaching for the seat belt, and with jerking motions like starting the leaf blower Goal status: IN PROGRESS   PLAN:  PT FREQUENCY: 2x/week  PT DURATION: 8 weeks  PLANNED INTERVENTIONS: 97164- PT Re-evaluation, 97110-Therapeutic exercises, 97530- Therapeutic activity, 97112- Neuromuscular re-education, 97535- Self Care, [id number]- Manual therapy, 97014- Electrical stimulation (unattended), [id number]- Electrical stimulation (manual), 97016- Vasopneumatic device, [id number]- Ultrasound, [id number]- Ionotophoresis 4mg /ml Dexamethasone, Patient/Family education, Taping, Dry Needling, Joint mobilization, Spinal mobilization, Cryotherapy, and Moist heat  PLAN FOR NEXT  SESSION: Assess response to DN; progress postural stretching and scapular strengthening/stabilization, adding RTC strengthening as tolerated;  MT +/- DN as indicated to address abnormal muscle tension/tightness and pain    Marry Guan, PT 08/13/2023, 11:02 AM

## 2023-08-16 ENCOUNTER — Ambulatory Visit: Payer: Commercial Managed Care - PPO | Admitting: Physical Therapy

## 2023-08-16 ENCOUNTER — Encounter: Payer: Self-pay | Admitting: Physical Therapy

## 2023-08-16 DIAGNOSIS — M6281 Muscle weakness (generalized): Secondary | ICD-10-CM | POA: Diagnosis not present

## 2023-08-16 DIAGNOSIS — R293 Abnormal posture: Secondary | ICD-10-CM

## 2023-08-16 DIAGNOSIS — M25512 Pain in left shoulder: Secondary | ICD-10-CM | POA: Diagnosis not present

## 2023-08-16 DIAGNOSIS — M25511 Pain in right shoulder: Secondary | ICD-10-CM | POA: Diagnosis not present

## 2023-08-16 NOTE — Therapy (Signed)
OUTPATIENT PHYSICAL THERAPY TREATMENT   Patient Name: Jackie Washington MRN: [id number] DOB:Oct 08, 1960, 62 y.o., female Today's Date: 08/16/2023   END OF SESSION:  PT End of Session - 08/16/23 0938     Visit Number 4    Date for PT Re-Evaluation 09/20/23    Authorization Type Lanetta Inch    PT Start Time [id number]    Activity Tolerance Patient tolerated treatment well    Behavior During Therapy [facility] for tasks assessed/performed              Past Medical History:  Diagnosis Date   Fatigue    Goiter diffuse, nontoxic    History of basal cell carcinoma    Hyperlipidemia    Hypertension    Iron deficiency anemia    Prediabetes    Vaginal neoplasm benign    Vitamin D deficiency    Past Surgical History:  Procedure Laterality Date   ABDOMINAL HYSTERECTOMY     APPENDECTOMY     BREAST CYST ASPIRATION     8 years ago unsure of laterality    KNEE SURGERY     Patient Active Problem List   Diagnosis Date Noted   Mild dilation of ascending aorta (HCC) 04/10/2023   Elevated coronary artery calcium score 04/10/2023   Prediabetes 03/29/2023   Mixed hyperlipidemia 03/29/2023   Essential hypertension 03/29/2023   PVCs (premature ventricular contractions) 03/29/2023   Acquired hypothyroidism 03/29/2023    PCP: Marylen Ponto, MD   REFERRING PROVIDER: Linda Hedges, MD   REFERRING DIAG: M75.02 (ICD-10-CM) - Adhesive capsulitis of left shoulder   THERAPY DIAG:  Acute pain of both shoulders  Abnormal posture  Muscle weakness (generalized)  RATIONALE FOR EVALUATION AND TREATMENT: Rehabilitation  ONSET DATE: end of August 2024  NEXT MD VISIT: 08/18/23 or PRN    SUBJECTIVE:                                                                                                                                                                                                         SUBJECTIVE STATEMENT: Pt reports she had a fall on Friday, landing on her R arm and also  irritating her back, therefore increased pain today.  Prior to the fall, she had noted benefit from the DN last session.  PAIN: Are you having pain? Yes: NPRS scale:  0 /10 Pain location: L anterior upper arm/shoulder radiating into chest at times Pain description: short sharp pain with activities like pulling the blower, dull with reaching to put a bra on or pull the seatbelt Aggravating factors:  lifting heavy items like groceries, putting a bra on, reaching for the seat belt, jerking motions like starting the leaf blower Relieving factors: rest, cease the triggering activity  Are you having pain? Yes: NPRS scale: with movement/activity 5/10 Pain location: R anterior upper arm/shoulder radiating into chest at times Pain description: dull  Aggravating factors: lifting heavy items like groceries, putting a bra on, reaching for the seat belt, jerking motions like starting the leaf blower Relieving factors: rest, cease the triggering activity  PERTINENT HISTORY:  HTN, fatigue, prediabetes, hypothyroidism, knee surgery, basal cell carcinoma, HLD  PRECAUTIONS: None  RED FLAGS: None  HAND DOMINANCE: Right  WEIGHT BEARING RESTRICTIONS: No  FALLS:  Has patient fallen in last 6 months? No  LIVING ENVIRONMENT: Lives with: lives with their spouse Lives in: House/apartment  OCCUPATION: Home maker  PLOF: Independent and Leisure: swimming, walking the dogs, reading  PATIENT GOALS: "To lessen the pain with daily activities (and ADLs)."   OBJECTIVE: (objective measures completed at initial evaluation unless otherwise dated)  DIAGNOSTIC FINDINGS:  None available  PATIENT SURVEYS:  Quick Dash 20.5 / 100 = 20.5 %  COGNITION: Overall cognitive status: Within functional limits for tasks assessed     SENSATION: WFL  POSTURE: rounded shoulders and forward head  UPPER EXTREMITY ROM:  B shoulder PROM WNL with some discomfort noted with L shoulder flexion & ER at end ROM  Active  ROM Right eval Left eval  Shoulder flexion 153 * 142 *  Shoulder extension 24 * 38 *  Shoulder abduction 132 * 125 *  Shoulder adduction    Shoulder internal rotation FIR to L1* FIR to L1*  Shoulder external rotation FER to upper scapula* FER to upper scapula*  Elbow flexion    Elbow extension    Wrist flexion    Wrist extension    Wrist ulnar deviation    Wrist radial deviation    Wrist pronation    Wrist supination    (Blank rows = not tested, * - discomfort)  UPPER EXTREMITY MMT:  MMT Right eval Left eval  Shoulder flexion 4- ^ 3+   Shoulder extension 4 ^ 4  Shoulder abduction 4- ^ 3+ ^  Shoulder adduction    Shoulder internal rotation 4 ^ 4+   Shoulder external rotation 4 ^ 4+ *  Middle trapezius    Lower trapezius    Elbow flexion    Elbow extension    Wrist flexion    Wrist extension    Wrist ulnar deviation    Wrist radial deviation    Wrist pronation    Wrist supination    Grip strength (lbs)    (Blank rows = not tested, ^ = pain, * - discomfort)  SHOULDER SPECIAL TESTS: Impingement tests: Neer impingement test: negative and Hawkins/Kennedy impingement test: positive  on L Rotator cuff assessment: Empty can test: negative and Full can test: negative  JOINT MOBILITY TESTING:  WFL  PALPATION:  TTP over R pec minor    TODAY'S TREATMENT:   08/16/23 THERAPEUTIC EXERCISE: to improve flexibility, strength and mobility.  Demonstration, verbal and tactile cues throughout for technique.  UBE L2.0 x 6 min (3' each fwd & back)  MANUAL THERAPY: To promote normalized muscle tension, improved flexibility, improved joint mobility, increased ROM, and reduced pain. Skilled palpation and monitoring of soft tissue during DN Trigger Point Dry-Needling  Treatment instructions: Expect mild to moderate muscle soreness. S/S of pneumothorax if dry needled over a lung field, and to seek immediate  medical attention should they occur. Patient verbalized understanding of these  instructions and education. Patient Consent Given: Yes Education handout provided: Previously provided Muscles treated: R infraspinatus (multiple locations), anterior and lateral deltoid, lateral pecs Electrical stimulation performed: No Parameters: N/A Treatment response/outcome: Twitch Response Elicited and Palpable Increase in Muscle Length STM/DTM, manual TPR and pin & stretch to muscles addressed with DN Kinesiotaping - R shoulder: Deltoid inhibition - Y strip Scapular facilitation - Y strip   08/13/23 THERAPEUTIC EXERCISE: to improve flexibility, strength and mobility.  Demonstration, verbal and tactile cues throughout for technique.  UBE L2.0 x 6 min (3' each fwd & back) Standing RTB scap retraction (into doorframe) + B shoulder ER 10 x 3" Standing RTB scap retraction (into doorframe) + B shoulder horiz ABD 10 x 3" Standing RTB scap retraction (into doorframe) + B shoulder horiz ABD diagonals 10 x 3" B lower trap setting - Y wall slide with looped YTB at wrists + slight lift off wall x 10 - cues to avoid pulling arms back past head Wall push-up plus x 10 B serratus wall clocks x 10  MANUAL THERAPY: To promote normalized muscle tension, improved flexibility, and reduced pain. Skilled palpation and monitoring of soft tissue during DN Trigger Point Dry-Needling  Treatment instructions: Expect mild to moderate muscle soreness. S/S of pneumothorax if dry needled over a lung field, and to seek immediate medical attention should they occur. Patient verbalized understanding of these instructions and education. Patient Consent Given: Yes Education handout provided: Yes Muscles treated: R anterior & lateral deltoid, R UT Electrical stimulation performed: No Parameters: N/A Treatment response/outcome: Twitch Response Elicited and Palpable Increase in Muscle Length STM/DTM, manual TPR and pin & stretch to muscles addressed with DN   08/10/23 THERAPEUTIC EXERCISE: to improve flexibility,  strength and mobility.  Demonstration, verbal and tactile cues throughout for technique.  UBE L1.0 x 3 min both ways Pec doorway stretch x 30 sec B low Standing GTB scap retraction + B shoulder row 10 x 5" Standing GTB scap retraction + B shoulder extension 10 x 5" ER and scap retraction RTBx 10  S/L L shoulder abduction x 10  S/L L shoulder ER x 10  Supine B shoulder flexion x 10    07/26/23 - Eval THERAPEUTIC EXERCISE: to improve flexibility, strength and mobility.  Demonstration, verbal and tactile cues throughout for technique.  Single arm doorway pec stretch at ~60 ABD x 30" bil - 1 rep each with arm straight and elbow bent - pt preferring the former B scap retraction & depression 10 x 5" Standing RTB scap retraction + B shoulder row 10 x 5" Standing RTB scap retraction + B shoulder extension 10 x 5"   PATIENT EDUCATION:  Education details: continue with current HEP, role of DN, DN rational, procedure, outcomes, potential side effects, and recommended post-treatment exercises/activity, and Ktape wearing and removal instructions  Person educated: Patient Education method: Explanation and Handouts Education comprehension: verbalized understanding  HOME EXERCISE PROGRAM: Access Code: [email or web address] URL: [email or web address] Date: 08/16/2023 Prepared by: Glenetta Hew  Exercises - Doorway Pec Stretch at 60 Degrees Abduction with Arm Straight  - 2-3 x daily - 7 x weekly - 3 reps - 30 sec hold - Single Arm Doorway Pec Stretch at 60 Elevation  - 2-3 x daily - 7 x weekly - 3 reps - 30 sec hold - Seated Scapular Retraction  - 2 x daily - 7 x weekly - 2 sets - 10 reps - 3-5 sec hold -  Scapular Retraction with Resistance Advanced  - 1 x daily - 7 x weekly - 2 sets - 10 reps - 5 sec hold - Standing Bilateral Low Shoulder Row with Anchored Resistance  - 1 x daily - 7 x weekly - 2 sets - 10 reps - 5 sec hold - Shoulder External Rotation and Scapular Retraction with Resistance  -  1 x daily - 7 x weekly - 2 sets - 10 reps  Patient Education - Trigger Point Dry Needling - Kinesiology tape   ASSESSMENT:  CLINICAL IMPRESSION: Jashayla "Claris Che" reports benefit from the DN last session, however increased pain reported today due to a fall on Friday where she landed/caught herself on her right arm.  She also notes increased LBP as a result of the fall.  R shoulder more rounded/protracted today with increased muscle tension noted in several muscles including infraspinatus, deltoids and pecs.  Abnormal muscle tension addressed with MT incorporating DN followed by application of Kinesiotape to further promote deltoid inhibition and facilitation of scapular/posterior shoulder muscle engagement.  Claris Che was due to have a follow-up with the doctor on Wednesday but is considering rescheduling this as she feels like therapy has been helping prior to the fall and wants to continue to give therapy a chance to address the problem.  Encouraged her to ease back into her HEP, trying the motions without the resistance initially and then gently resuming resistance as tolerated without increased pain.  Claris Che will benefit from continued skilled PT to address ongoing deficits to improve mobility and activity tolerance with decreased pain interference.   OBJECTIVE IMPAIRMENTS: decreased activity tolerance, decreased knowledge of condition, decreased ROM, decreased strength, hypomobility, increased fascial restrictions, impaired perceived functional ability, increased muscle spasms, impaired flexibility, impaired UE functional use, improper body mechanics, postural dysfunction, and pain.   ACTIVITY LIMITATIONS: carrying, lifting, sleeping, bathing, dressing, reach over head, hygiene/grooming, and caring for others  PARTICIPATION LIMITATIONS: meal prep, cleaning, laundry, shopping, community activity, and yard work  PERSONAL FACTORS: Past/current experiences, Time since onset of  injury/illness/exacerbation, and 3+ comorbidities: HTN, fatigue, prediabetes, hypothyroidism, knee surgery, basal cell carcinoma, HLD  are also affecting patient's functional outcome.   REHAB POTENTIAL: Good  CLINICAL DECISION MAKING: Evolving/moderate complexity  EVALUATION COMPLEXITY: Moderate   GOALS: Goals reviewed with patient? Yes  SHORT TERM GOALS: Target date: 08/23/2023   Patient will be independent with initial HEP to improve outcomes and carryover.  Baseline:  Goal status: IN PROGRESS  LONG TERM GOALS: Target date: 09/20/2023  Patient will be independent with ongoing/advanced HEP for self-management at home.  Baseline:  Goal status: IN PROGRESS  2.  Patient will report 50-75% improvement in B shoulder pain to improve QOL.  Baseline: L shoulder up to 2/10 since injection, R shoulder up to 3/10 Goal status: IN PROGRESS  3.  Patient to demonstrate improved upright posture with posterior shoulder girdle engaged to promote improved glenohumeral joint mobility. Baseline: fwd head and rounded shoulder posture Goal status: IN PROGRESS  4.  Patient to improve B shoulder AROM to WFL/WNL without pain provocation to allow for increased ease of ADLs.  Baseline: Refer to above UE ROM table Goal status: IN PROGRESS  5.  Patient will demonstrate improved B shoulder strength to >/= 4 to 4+/5 for functional UE use. Baseline: Refer to above UE MMT table Goal status: IN PROGRESS  6  Patient will report </= 11% on QuickDASH to demonstrate improved functional ability.  Baseline: 20.5 / 100 = 20.5 % Goal status: IN  PROGRESS  7.  Patient will report no sleep disturbance due to B shoulder pain.   Baseline: difficulty finding a comfortable sleeping position d/t B shoulder pain Goal status: IN PROGRESS   8. Patient to report ability to perform ADLs, household, and leisure activities without limitation due to increased shoulder pain, LOM or weakness. Baseline: difficulty lifting heavy  items like groceries, putting a bra on, reaching for the seat belt, and with jerking motions like starting the leaf blower Goal status: IN PROGRESS   PLAN:  PT FREQUENCY: 2x/week  PT DURATION: 8 weeks  PLANNED INTERVENTIONS: 97164- PT Re-evaluation, 97110-Therapeutic exercises, 97530- Therapeutic activity, 97112- Neuromuscular re-education, 97535- Self Care, [id number]- Manual therapy, 97014- Electrical stimulation (unattended), [id number]- Electrical stimulation (manual), 97016- Vasopneumatic device, [id number]- Ultrasound, [id number]- Ionotophoresis 4mg /ml Dexamethasone, Patient/Family education, Taping, Dry Needling, Joint mobilization, Spinal mobilization, Cryotherapy, and Moist heat  PLAN FOR NEXT SESSION: Assess response to DN; STG assessment; reassess shoulder ROM; progress postural stretching and scapular strengthening/stabilization, adding RTC strengthening as tolerated;  MT +/- DN as indicated to address abnormal muscle tension/tightness and pain    Marry Guan, PT 08/16/2023, 9:42 AM

## 2023-08-23 ENCOUNTER — Ambulatory Visit: Payer: Commercial Managed Care - PPO | Admitting: Physical Therapy

## 2023-08-23 ENCOUNTER — Encounter: Payer: Self-pay | Admitting: Physical Therapy

## 2023-08-23 DIAGNOSIS — R293 Abnormal posture: Secondary | ICD-10-CM

## 2023-08-23 DIAGNOSIS — M6281 Muscle weakness (generalized): Secondary | ICD-10-CM

## 2023-08-23 DIAGNOSIS — M25511 Pain in right shoulder: Secondary | ICD-10-CM

## 2023-08-23 DIAGNOSIS — M25512 Pain in left shoulder: Secondary | ICD-10-CM | POA: Diagnosis not present

## 2023-08-23 NOTE — Therapy (Signed)
OUTPATIENT PHYSICAL THERAPY TREATMENT   Patient Name: Jackie Washington MRN: [id number] DOB:15-Dec-1960, 62 y.o., female Today's Date: 08/23/2023   END OF SESSION:  PT End of Session - 08/23/23 0932     Visit Number 5    Date for PT Re-Evaluation 09/20/23    Authorization Type [employer] - [facility]    PT Start Time 0932    PT Stop Time 1012    PT Time Calculation (min) 40 min    Activity Tolerance Patient tolerated treatment well    Behavior During Therapy WFL for tasks assessed/performed               Past Medical History:  Diagnosis Date   Fatigue    Goiter diffuse, nontoxic    History of basal cell carcinoma    Hyperlipidemia    Hypertension    Iron deficiency anemia    Prediabetes    Vaginal neoplasm benign    Vitamin D deficiency    Past Surgical History:  Procedure Laterality Date   ABDOMINAL HYSTERECTOMY     APPENDECTOMY     BREAST CYST ASPIRATION     8 years ago unsure of laterality    KNEE SURGERY     Patient Active Problem List   Diagnosis Date Noted   Mild dilation of ascending aorta (HCC) 04/10/2023   Elevated coronary artery calcium score 04/10/2023   Prediabetes 03/29/2023   Mixed hyperlipidemia 03/29/2023   Essential hypertension 03/29/2023   PVCs (premature ventricular contractions) 03/29/2023   Acquired hypothyroidism 03/29/2023    PCP: Marylen Ponto, MD   REFERRING PROVIDER: Linda Hedges, MD   REFERRING DIAG: M75.02 (ICD-10-CM) - Adhesive capsulitis of left shoulder   THERAPY DIAG:  Acute pain of both shoulders  Abnormal posture  Muscle weakness (generalized)  RATIONALE FOR EVALUATION AND TREATMENT: Rehabilitation  ONSET DATE: end of August 2024  NEXT MD VISIT: TBD   SUBJECTIVE:                                                                                                                                                                                                         SUBJECTIVE STATEMENT: Pt reports she felt much  better after the DN last session.  PAIN: Are you having pain? Yes: NPRS scale:  0 /10 Pain location: L anterior upper arm/shoulder radiating into chest at times Pain description: short sharp pain with activities like pulling the blower, dull with reaching to put a bra on or pull the seatbelt Aggravating factors: lifting heavy items like groceries, putting a bra on, reaching for  the seat belt, jerking motions like starting the leaf blower Relieving factors: rest, cease the triggering activity  Are you having pain? Yes: NPRS scale: with movement/activity 2-3/10 Pain location: R anterior upper arm/shoulder radiating into chest at times Pain description: dull  Aggravating factors: lifting heavy items like groceries, putting a bra on, reaching for the seat belt, jerking motions like starting the leaf blower Relieving factors: rest, cease the triggering activity  PERTINENT HISTORY:  HTN, fatigue, prediabetes, hypothyroidism, knee surgery, basal cell carcinoma, HLD  PRECAUTIONS: None  RED FLAGS: None  HAND DOMINANCE: Right  WEIGHT BEARING RESTRICTIONS: No  FALLS:  Has patient fallen in last 6 months? No  LIVING ENVIRONMENT: Lives with: lives with their spouse Lives in: House/apartment  OCCUPATION: Home maker  PLOF: Independent and Leisure: swimming, walking the dogs, reading  PATIENT GOALS: "To lessen the pain with daily activities (and ADLs)."   OBJECTIVE: (objective measures completed at initial evaluation unless otherwise dated)  DIAGNOSTIC FINDINGS:  None available  PATIENT SURVEYS:  Quick Dash 20.5 / 100 = 20.5 %  COGNITION: Overall cognitive status: Within functional limits for tasks assessed     SENSATION: WFL  POSTURE: rounded shoulders and forward head  UPPER EXTREMITY ROM:  B shoulder PROM WNL with some discomfort noted with L shoulder flexion & ER at end ROM  Active ROM Right eval Left eval R 08/23/23 L 08/23/23  Shoulder flexion 153 * 142 * 177 174   Shoulder extension 24 * 38 * 34 44  Shoulder abduction 132 * 125 * 177 162  Shoulder adduction      Shoulder internal rotation FIR to L1* FIR to L1* FIR T10 FIR T8  Shoulder external rotation FER to upper scapula* FER to upper scapula* FER T4 FER T4  Elbow flexion      Elbow extension      Wrist flexion      Wrist extension      Wrist ulnar deviation      Wrist radial deviation      Wrist pronation      Wrist supination      (Blank rows = not tested, * - discomfort)  UPPER EXTREMITY MMT:  MMT Right eval Left eval  Shoulder flexion 4- ^ 3+   Shoulder extension 4 ^ 4  Shoulder abduction 4- ^ 3+ ^  Shoulder adduction    Shoulder internal rotation 4 ^ 4+   Shoulder external rotation 4 ^ 4+ *  Middle trapezius    Lower trapezius    Elbow flexion    Elbow extension    Wrist flexion    Wrist extension    Wrist ulnar deviation    Wrist radial deviation    Wrist pronation    Wrist supination    Grip strength (lbs)    (Blank rows = not tested, ^ = pain, * - discomfort)  SHOULDER SPECIAL TESTS: Impingement tests: Neer impingement test: negative and Hawkins/Kennedy impingement test: positive  on L Rotator cuff assessment: Empty can test: negative and Full can test: negative  JOINT MOBILITY TESTING:  WFL  PALPATION:  TTP over R pec minor    TODAY'S TREATMENT:   08/23/23 THERAPEUTIC EXERCISE: to improve flexibility, strength and mobility.  Demonstration, verbal and tactile cues throughout for technique.  UBE L2.5 x 6 min (3' each fwd & back) Prone over green Pball - I's, T's, Y's & W's x 10 B lower trap setting - Y wall slide with looped YTB at wrists + slight lift  off wall x 10 - cues to avoid pulling arms back past head Wall push-up plus x 10 B serratus wall clocks with looped YTB at wrists x 10 Serratus roll-ups on 6" FR with looped YTB at wrists x 10 R shoulder FIR stretch/AAROM with towel x 10  THERAPEUTIC ACTIVITIES:  B shoulder ROM  assessment   08/16/23 THERAPEUTIC EXERCISE: to improve flexibility, strength and mobility.  Demonstration, verbal and tactile cues throughout for technique.  UBE L2.0 x 6 min (3' each fwd & back)  MANUAL THERAPY: To promote normalized muscle tension, improved flexibility, improved joint mobility, increased ROM, and reduced pain. Skilled palpation and monitoring of soft tissue during DN Trigger Point Dry-Needling  Treatment instructions: Expect mild to moderate muscle soreness. S/S of pneumothorax if dry needled over a lung field, and to seek immediate medical attention should they occur. Patient verbalized understanding of these instructions and education. Patient Consent Given: Yes Education handout provided: Previously provided Muscles treated: R infraspinatus (multiple locations), anterior and lateral deltoid, lateral pecs Electrical stimulation performed: No Parameters: N/A Treatment response/outcome: Twitch Response Elicited and Palpable Increase in Muscle Length STM/DTM, manual TPR and pin & stretch to muscles addressed with DN Kinesiotaping - R shoulder: Deltoid inhibition - Y strip Scapular facilitation - Y strip   08/13/23 THERAPEUTIC EXERCISE: to improve flexibility, strength and mobility.  Demonstration, verbal and tactile cues throughout for technique.  UBE L2.0 x 6 min (3' each fwd & back) Standing RTB scap retraction (into doorframe) + B shoulder ER 10 x 3" Standing RTB scap retraction (into doorframe) + B shoulder horiz ABD 10 x 3" Standing RTB scap retraction (into doorframe) + B shoulder horiz ABD diagonals 10 x 3" B lower trap setting - Y wall slide with looped YTB at wrists + slight lift off wall x 10 - cues to avoid pulling arms back past head Wall push-up plus x 10 B serratus wall clocks with looped YTB at wrists x 10  MANUAL THERAPY: To promote normalized muscle tension, improved flexibility, and reduced pain. Skilled palpation and monitoring of soft tissue  during DN Trigger Point Dry-Needling  Treatment instructions: Expect mild to moderate muscle soreness. S/S of pneumothorax if dry needled over a lung field, and to seek immediate medical attention should they occur. Patient verbalized understanding of these instructions and education. Patient Consent Given: Yes Education handout provided: Yes Muscles treated: R anterior & lateral deltoid, R UT Electrical stimulation performed: No Parameters: N/A Treatment response/outcome: Twitch Response Elicited and Palpable Increase in Muscle Length STM/DTM, manual TPR and pin & stretch to muscles addressed with DN   PATIENT EDUCATION:  Education details: continue with current HEP, role of DN, DN rational, procedure, outcomes, potential side effects, and recommended post-treatment exercises/activity, and Ktape wearing and removal instructions  Person educated: Patient Education method: Explanation and Handouts Education comprehension: verbalized understanding  HOME EXERCISE PROGRAM: Access Code: [email or web address] URL: [email or web address] Date: 08/16/2023 Prepared by: Glenetta Hew  Exercises - Doorway Pec Stretch at 60 Degrees Abduction with Arm Straight  - 2-3 x daily - 7 x weekly - 3 reps - 30 sec hold - Single Arm Doorway Pec Stretch at 60 Elevation  - 2-3 x daily - 7 x weekly - 3 reps - 30 sec hold - Seated Scapular Retraction  - 2 x daily - 7 x weekly - 2 sets - 10 reps - 3-5 sec hold - Scapular Retraction with Resistance Advanced  - 1 x daily - 7 x weekly - 2 sets -  10 reps - 5 sec hold - Standing Bilateral Low Shoulder Row with Anchored Resistance  - 1 x daily - 7 x weekly - 2 sets - 10 reps - 5 sec hold - Shoulder External Rotation and Scapular Retraction with Resistance  - 1 x daily - 7 x weekly - 2 sets - 10 reps  Patient Education - Trigger Point Dry Needling - Kinesiology tape   ASSESSMENT:  CLINICAL IMPRESSION: Anahid "Claris Che" reports continued benefit from the DN  last session with decreased pain reported today.  Progressed thoracic and posterior shoulder/scapular strengthening with prone Pball exercises - pt noting very mild discomfort in L shoulder more so than R which resolved after completion of exercises.  Reviewed lower trap and serratus strengthening with cues necessary for proper technique and muscle activation.  B shoulder ROM significantly improved with only slight limitation in L shoulder abduction and R shoulder FIR.  Provided instruction in FIR stretch/AAROM using towel with patient declining formal handouts.  Claris Che will benefit from continued skilled PT to address ongoing deficits to improve mobility and activity tolerance with decreased pain interference.   OBJECTIVE IMPAIRMENTS: decreased activity tolerance, decreased knowledge of condition, decreased ROM, decreased strength, hypomobility, increased fascial restrictions, impaired perceived functional ability, increased muscle spasms, impaired flexibility, impaired UE functional use, improper body mechanics, postural dysfunction, and pain.   ACTIVITY LIMITATIONS: carrying, lifting, sleeping, bathing, dressing, reach over head, hygiene/grooming, and caring for others  PARTICIPATION LIMITATIONS: meal prep, cleaning, laundry, shopping, community activity, and yard work  PERSONAL FACTORS: Past/current experiences, Time since onset of injury/illness/exacerbation, and 3+ comorbidities: HTN, fatigue, prediabetes, hypothyroidism, knee surgery, basal cell carcinoma, HLD  are also affecting patient's functional outcome.   REHAB POTENTIAL: Good  CLINICAL DECISION MAKING: Evolving/moderate complexity  EVALUATION COMPLEXITY: Moderate   GOALS: Goals reviewed with patient? Yes  SHORT TERM GOALS: Target date: 08/23/2023   Patient will be independent with initial HEP to improve outcomes and carryover.  Baseline:  Goal status: MET - 08/23/23  LONG TERM GOALS: Target date: 09/20/2023  Patient will be  independent with ongoing/advanced HEP for self-management at home.  Baseline:  Goal status: IN PROGRESS  2.  Patient will report 50-75% improvement in B shoulder pain to improve QOL.  Baseline: L shoulder up to 2/10 since injection, R shoulder up to 3/10 Goal status: IN PROGRESS  3.  Patient to demonstrate improved upright posture with posterior shoulder girdle engaged to promote improved glenohumeral joint mobility. Baseline: fwd head and rounded shoulder posture Goal status: IN PROGRESS  4.  Patient to improve B shoulder AROM to WFL/WNL without pain provocation to allow for increased ease of ADLs.  Baseline: Refer to above UE ROM table Goal status: PARTIALLY MET - 08/23/23 - Met except slightly limited in R shoulder FIR  5.  Patient will demonstrate improved B shoulder strength to >/= 4 to 4+/5 for functional UE use. Baseline: Refer to above UE MMT table Goal status: IN PROGRESS  6  Patient will report </= 11% on QuickDASH to demonstrate improved functional ability.  Baseline: 20.5 / 100 = 20.5 % Goal status: IN PROGRESS  7.  Patient will report no sleep disturbance due to B shoulder pain.   Baseline: difficulty finding a comfortable sleeping position d/t B shoulder pain Goal status: IN PROGRESS   8. Patient to report ability to perform ADLs, household, and leisure activities without limitation due to increased shoulder pain, LOM or weakness. Baseline: difficulty lifting heavy items like groceries, putting a bra on, reaching  for the seat belt, and with jerking motions like starting the leaf blower Goal status: IN PROGRESS   PLAN:  PT FREQUENCY: 2x/week  PT DURATION: 8 weeks  PLANNED INTERVENTIONS: 97164- PT Re-evaluation, 97110-Therapeutic exercises, 97530- Therapeutic activity, 97112- Neuromuscular re-education, 97535- Self Care, 16109- Manual therapy, 97014- Electrical stimulation (unattended), [id number]- Electrical stimulation (manual), 97016- Vasopneumatic device, [id number]-  Ultrasound, [id number]- Ionotophoresis 4mg /ml Dexamethasone, Patient/Family education, Taping, Dry Needling, Joint mobilization, Spinal mobilization, Cryotherapy, and Moist heat  PLAN FOR NEXT SESSION: Reassess shoulder strength/MMT including middle and lower traps; progress postural stretching and scapular strengthening/stabilization, adding RTC strengthening as tolerated;  MT +/- DN as indicated to address abnormal muscle tension/tightness and pain    Marry Guan, PT 08/23/2023, 10:15 AM

## 2023-08-26 ENCOUNTER — Ambulatory Visit: Payer: Commercial Managed Care - PPO

## 2023-08-26 ENCOUNTER — Other Ambulatory Visit (HOSPITAL_COMMUNITY): Payer: Self-pay

## 2023-08-26 DIAGNOSIS — M6281 Muscle weakness (generalized): Secondary | ICD-10-CM

## 2023-08-26 DIAGNOSIS — M25511 Pain in right shoulder: Secondary | ICD-10-CM

## 2023-08-26 DIAGNOSIS — R293 Abnormal posture: Secondary | ICD-10-CM | POA: Diagnosis not present

## 2023-08-26 DIAGNOSIS — M25512 Pain in left shoulder: Secondary | ICD-10-CM | POA: Diagnosis not present

## 2023-08-26 NOTE — Therapy (Signed)
OUTPATIENT PHYSICAL THERAPY TREATMENT   Patient Name: Jackie Washington MRN: [id number] DOB:1960/10/16, 62 y.o., female Today's Date: 08/26/2023   END OF SESSION:  PT End of Session - 08/26/23 1014     Visit Number 6    Date for PT Re-Evaluation 09/20/23    Authorization Type [employer] - [facility]    PT Start Time 0932    PT Stop Time 1014    PT Time Calculation (min) 42 min    Activity Tolerance Patient tolerated treatment well    Behavior During Therapy WFL for tasks assessed/performed                Past Medical History:  Diagnosis Date   Fatigue    Goiter diffuse, nontoxic    History of basal cell carcinoma    Hyperlipidemia    Hypertension    Iron deficiency anemia    Prediabetes    Vaginal neoplasm benign    Vitamin D deficiency    Past Surgical History:  Procedure Laterality Date   ABDOMINAL HYSTERECTOMY     APPENDECTOMY     BREAST CYST ASPIRATION     8 years ago unsure of laterality    KNEE SURGERY     Patient Active Problem List   Diagnosis Date Noted   Mild dilation of ascending aorta (HCC) 04/10/2023   Elevated coronary artery calcium score 04/10/2023   Prediabetes 03/29/2023   Mixed hyperlipidemia 03/29/2023   Essential hypertension 03/29/2023   PVCs (premature ventricular contractions) 03/29/2023   Acquired hypothyroidism 03/29/2023    PCP: Marylen Ponto, MD   REFERRING PROVIDER: Linda Hedges, MD   REFERRING DIAG: M75.02 (ICD-10-CM) - Adhesive capsulitis of left shoulder   THERAPY DIAG:  Acute pain of both shoulders  Abnormal posture  Muscle weakness (generalized)  RATIONALE FOR EVALUATION AND TREATMENT: Rehabilitation  ONSET DATE: end of August 2024  NEXT MD VISIT: TBD   SUBJECTIVE:                                                                                                                                                                                                         SUBJECTIVE STATEMENT: Pt reports mild pain  today in R shoulder.  PAIN: Are you having pain? Yes: NPRS scale:  0 /10 Pain location: L anterior upper arm/shoulder radiating into chest at times Pain description: short sharp pain with activities like pulling the blower, dull with reaching to put a bra on or pull the seatbelt Aggravating factors: lifting heavy items like groceries, putting a bra on, reaching for the seat  belt, jerking motions like starting the leaf blower Relieving factors: rest, cease the triggering activity  Are you having pain? Yes: NPRS scale: with movement/activity 1/10 Pain location: R anterior upper arm/shoulder radiating into chest at times Pain description: dull  Aggravating factors: lifting heavy items like groceries, putting a bra on, reaching for the seat belt, jerking motions like starting the leaf blower Relieving factors: rest, cease the triggering activity  PERTINENT HISTORY:  HTN, fatigue, prediabetes, hypothyroidism, knee surgery, basal cell carcinoma, HLD  PRECAUTIONS: None  RED FLAGS: None  HAND DOMINANCE: Right  WEIGHT BEARING RESTRICTIONS: No  FALLS:  Has patient fallen in last 6 months? No  LIVING ENVIRONMENT: Lives with: lives with their spouse Lives in: House/apartment  OCCUPATION: Home maker  PLOF: Independent and Leisure: swimming, walking the dogs, reading  PATIENT GOALS: "To lessen the pain with daily activities (and ADLs)."   OBJECTIVE: (objective measures completed at initial evaluation unless otherwise dated)  DIAGNOSTIC FINDINGS:  None available  PATIENT SURVEYS:  Quick Dash 20.5 / 100 = 20.5 %  COGNITION: Overall cognitive status: Within functional limits for tasks assessed     SENSATION: WFL  POSTURE: rounded shoulders and forward head  UPPER EXTREMITY ROM:  B shoulder PROM WNL with some discomfort noted with L shoulder flexion & ER at end ROM  Active ROM Right eval Left eval R 08/23/23 L 08/23/23  Shoulder flexion 153 * 142 * 177 174  Shoulder  extension 24 * 38 * 34 44  Shoulder abduction 132 * 125 * 177 162  Shoulder adduction      Shoulder internal rotation FIR to L1* FIR to L1* FIR T10 FIR T8  Shoulder external rotation FER to upper scapula* FER to upper scapula* FER T4 FER T4  Elbow flexion      Elbow extension      Wrist flexion      Wrist extension      Wrist ulnar deviation      Wrist radial deviation      Wrist pronation      Wrist supination      (Blank rows = not tested, * - discomfort)  UPPER EXTREMITY MMT:  MMT Right eval Left eval R 08/26/23 :L 08/26/23  Shoulder flexion 4- ^ 3+  4+ 4+  Shoulder extension 4 ^ 4 4+ 4+  Shoulder abduction 4- ^ 3+ ^ 4 4+  Shoulder adduction      Shoulder internal rotation 4 ^ 4+  4+ 5  Shoulder external rotation 4 ^ 4+ * 4 4+  Middle trapezius   4 4-  Lower trapezius   4 5  Elbow flexion      Elbow extension      Wrist flexion      Wrist extension      Wrist ulnar deviation      Wrist radial deviation      Wrist pronation      Wrist supination      Grip strength (lbs)      (Blank rows = not tested, ^ = pain, * - discomfort)  SHOULDER SPECIAL TESTS: Impingement tests: Neer impingement test: negative and Hawkins/Kennedy impingement test: positive  on L Rotator cuff assessment: Empty can test: negative and Full can test: negative  JOINT MOBILITY TESTING:  WFL  PALPATION:  TTP over R pec minor    TODAY'S TREATMENT:  08/26/23 THERAPEUTIC EXERCISE: to improve flexibility, strength and mobility.  Demonstration, verbal and tactile cues throughout for technique.  Nustep L5x30min UE/LE  UE MMT Prone mid row + scap retraction x 10; 2lb x 10  B ER YTB wrapped around wrist x 10 - 5 second hold Lower trap raises with RTB around wrist 2x10 Wall push up x 20 - cues to depress her scapulae Review and update of HEP 08/23/23 THERAPEUTIC EXERCISE: to improve flexibility, strength and mobility.  Demonstration, verbal and tactile cues throughout for technique.  UBE L2.5 x 6  min (3' each fwd & back) Prone over green Pball - I's, T's, Y's & W's x 10 B lower trap setting - Y wall slide with looped YTB at wrists + slight lift off wall x 10 - cues to avoid pulling arms back past head Wall push-up plus x 10 B serratus wall clocks with looped YTB at wrists x 10 Serratus roll-ups on 6" FR with looped YTB at wrists x 10 R shoulder FIR stretch/AAROM with towel x 10  THERAPEUTIC ACTIVITIES:  B shoulder ROM assessment   08/16/23 THERAPEUTIC EXERCISE: to improve flexibility, strength and mobility.  Demonstration, verbal and tactile cues throughout for technique.  UBE L2.0 x 6 min (3' each fwd & back)  MANUAL THERAPY: To promote normalized muscle tension, improved flexibility, improved joint mobility, increased ROM, and reduced pain. Skilled palpation and monitoring of soft tissue during DN Trigger Point Dry-Needling  Treatment instructions: Expect mild to moderate muscle soreness. S/S of pneumothorax if dry needled over a lung field, and to seek immediate medical attention should they occur. Patient verbalized understanding of these instructions and education. Patient Consent Given: Yes Education handout provided: Previously provided Muscles treated: R infraspinatus (multiple locations), anterior and lateral deltoid, lateral pecs Electrical stimulation performed: No Parameters: N/A Treatment response/outcome: Twitch Response Elicited and Palpable Increase in Muscle Length STM/DTM, manual TPR and pin & stretch to muscles addressed with DN Kinesiotaping - R shoulder: Deltoid inhibition - Y strip Scapular facilitation - Y strip   08/13/23 THERAPEUTIC EXERCISE: to improve flexibility, strength and mobility.  Demonstration, verbal and tactile cues throughout for technique.  UBE L2.0 x 6 min (3' each fwd & back) Standing RTB scap retraction (into doorframe) + B shoulder ER 10 x 3" Standing RTB scap retraction (into doorframe) + B shoulder horiz ABD 10 x 3" Standing RTB  scap retraction (into doorframe) + B shoulder horiz ABD diagonals 10 x 3" B lower trap setting - Y wall slide with looped YTB at wrists + slight lift off wall x 10 - cues to avoid pulling arms back past head Wall push-up plus x 10 B serratus wall clocks with looped YTB at wrists x 10  MANUAL THERAPY: To promote normalized muscle tension, improved flexibility, and reduced pain. Skilled palpation and monitoring of soft tissue during DN Trigger Point Dry-Needling  Treatment instructions: Expect mild to moderate muscle soreness. S/S of pneumothorax if dry needled over a lung field, and to seek immediate medical attention should they occur. Patient verbalized understanding of these instructions and education. Patient Consent Given: Yes Education handout provided: Yes Muscles treated: R anterior & lateral deltoid, R UT Electrical stimulation performed: No Parameters: N/A Treatment response/outcome: Twitch Response Elicited and Palpable Increase in Muscle Length STM/DTM, manual TPR and pin & stretch to muscles addressed with DN   PATIENT EDUCATION:  Education details: continue with current HEP, role of DN, DN rational, procedure, outcomes, potential side effects, and recommended post-treatment exercises/activity, and Ktape wearing and removal instructions  Person educated: Patient Education method: Explanation and Handouts Education comprehension: verbalized understanding  HOME EXERCISE PROGRAM:  Access Code: [email or web address] URL: [email or web address] Date: 08/26/2023 Prepared by: Verta Ellen  Exercises - Doorway Pec Stretch at 60 Degrees Abduction with Arm Straight  - 2-3 x daily - 7 x weekly - 3 reps - 30 sec hold - Single Arm Doorway Pec Stretch at 60 Elevation  - 2-3 x daily - 7 x weekly - 3 reps - 30 sec hold - Seated Scapular Retraction  - 2 x daily - 7 x weekly - 2 sets - 10 reps - 3-5 sec hold - Scapular Retraction with Resistance Advanced  - 1 x daily - 7 x weekly - 2 sets  - 10 reps - 5 sec hold - Standing Bilateral Low Shoulder Row with Anchored Resistance  - 1 x daily - 7 x weekly - 2 sets - 10 reps - 5 sec hold - Shoulder External Rotation and Scapular Retraction with Resistance  - 1 x daily - 7 x weekly - 2 sets - 10 reps - Shoulder Flexion Serratus Activation with Resistance  - 1 x daily - 3 x weekly - 2 sets - 10 reps - Prone Scapular Retraction and Row  - 1 x daily - 3 x weekly - 2 sets - 10 reps  Patient Education - Trigger Point Dry Needling - Kinesiology tape   ASSESSMENT:  CLINICAL IMPRESSION: Pt strength is improving, however she shows more weakness in her mid and lower traps. Emphasized engaging these muscles with interventions. Required cues to fully engage these muscles, updated HEP accordingly for strengthening. She reported mild collarbone pain after wall push ups but subsided after we rested.  Claris Che will benefit from continued skilled PT to address ongoing deficits to improve mobility and activity tolerance with decreased pain interference.   OBJECTIVE IMPAIRMENTS: decreased activity tolerance, decreased knowledge of condition, decreased ROM, decreased strength, hypomobility, increased fascial restrictions, impaired perceived functional ability, increased muscle spasms, impaired flexibility, impaired UE functional use, improper body mechanics, postural dysfunction, and pain.   ACTIVITY LIMITATIONS: carrying, lifting, sleeping, bathing, dressing, reach over head, hygiene/grooming, and caring for others  PARTICIPATION LIMITATIONS: meal prep, cleaning, laundry, shopping, community activity, and yard work  PERSONAL FACTORS: Past/current experiences, Time since onset of injury/illness/exacerbation, and 3+ comorbidities: HTN, fatigue, prediabetes, hypothyroidism, knee surgery, basal cell carcinoma, HLD  are also affecting patient's functional outcome.   REHAB POTENTIAL: Good  CLINICAL DECISION MAKING: Evolving/moderate complexity  EVALUATION  COMPLEXITY: Moderate   GOALS: Goals reviewed with patient? Yes  SHORT TERM GOALS: Target date: 08/23/2023   Patient will be independent with initial HEP to improve outcomes and carryover.  Baseline:  Goal status: MET - 08/23/23  LONG TERM GOALS: Target date: 09/20/2023  Patient will be independent with ongoing/advanced HEP for self-management at home.  Baseline:  Goal status: IN PROGRESS  2.  Patient will report 50-75% improvement in B shoulder pain to improve QOL.  Baseline: L shoulder up to 2/10 since injection, R shoulder up to 3/10 Goal status: IN PROGRESS  3.  Patient to demonstrate improved upright posture with posterior shoulder girdle engaged to promote improved glenohumeral joint mobility. Baseline: fwd head and rounded shoulder posture Goal status: IN PROGRESS  4.  Patient to improve B shoulder AROM to WFL/WNL without pain provocation to allow for increased ease of ADLs.  Baseline: Refer to above UE ROM table Goal status: PARTIALLY MET - 08/23/23 - Met except slightly limited in R shoulder FIR  5.  Patient will demonstrate improved B shoulder strength to >/= 4 to 4+/5 for functional UE use.  Baseline: Refer to above UE MMT table Goal status: IN PROGRESS- 08/26/23  6  Patient will report </= 11% on QuickDASH to demonstrate improved functional ability.  Baseline: 20.5 / 100 = 20.5 % Goal status: IN PROGRESS  7.  Patient will report no sleep disturbance due to B shoulder pain.   Baseline: difficulty finding a comfortable sleeping position d/t B shoulder pain Goal status: IN PROGRESS   8. Patient to report ability to perform ADLs, household, and leisure activities without limitation due to increased shoulder pain, LOM or weakness. Baseline: difficulty lifting heavy items like groceries, putting a bra on, reaching for the seat belt, and with jerking motions like starting the leaf blower Goal status: IN PROGRESS   PLAN:  PT FREQUENCY: 2x/week  PT DURATION: 8  weeks  PLANNED INTERVENTIONS: 97164- PT Re-evaluation, 97110-Therapeutic exercises, 97530- Therapeutic activity, 97112- Neuromuscular re-education, 97535- Self Care, [id number]- Manual therapy, 97014- Electrical stimulation (unattended), [id number]- Electrical stimulation (manual), 97016- Vasopneumatic device, [id number]- Ultrasound, 29528- Ionotophoresis 4mg /ml Dexamethasone, Patient/Family education, Taping, Dry Needling, Joint mobilization, Spinal mobilization, Cryotherapy, and Moist heat  PLAN FOR NEXT SESSION: progress postural stretching and scapular strengthening/stabilization, adding RTC strengthening as tolerated;  MT +/- DN as indicated to address abnormal muscle tension/tightness and pain    Yasser Hepp L Arielle Eber, PTA 08/26/2023, 10:48 AM

## 2023-08-27 ENCOUNTER — Other Ambulatory Visit (HOSPITAL_COMMUNITY): Payer: Self-pay

## 2023-08-30 ENCOUNTER — Encounter: Payer: Commercial Managed Care - PPO | Admitting: Physical Therapy

## 2023-08-30 ENCOUNTER — Other Ambulatory Visit (HOSPITAL_BASED_OUTPATIENT_CLINIC_OR_DEPARTMENT_OTHER): Payer: Self-pay

## 2023-08-30 MED ORDER — LEVOTHYROXINE SODIUM 75 MCG PO TABS
75.0000 ug | ORAL_TABLET | Freq: Every day | ORAL | 0 refills | Status: DC
  Filled 2023-08-30: qty 90, 90d supply, fill #0

## 2023-09-07 ENCOUNTER — Other Ambulatory Visit (HOSPITAL_BASED_OUTPATIENT_CLINIC_OR_DEPARTMENT_OTHER): Payer: Self-pay

## 2023-09-10 ENCOUNTER — Encounter: Payer: Self-pay | Admitting: Physical Therapy

## 2023-09-10 ENCOUNTER — Ambulatory Visit: Payer: Commercial Managed Care - PPO | Attending: Orthopedic Surgery | Admitting: Physical Therapy

## 2023-09-10 DIAGNOSIS — M25511 Pain in right shoulder: Secondary | ICD-10-CM | POA: Insufficient documentation

## 2023-09-10 DIAGNOSIS — M25512 Pain in left shoulder: Secondary | ICD-10-CM | POA: Insufficient documentation

## 2023-09-10 DIAGNOSIS — R293 Abnormal posture: Secondary | ICD-10-CM | POA: Insufficient documentation

## 2023-09-10 DIAGNOSIS — M6281 Muscle weakness (generalized): Secondary | ICD-10-CM | POA: Insufficient documentation

## 2023-09-10 NOTE — Therapy (Signed)
 OUTPATIENT PHYSICAL THERAPY TREATMENT   Patient Name: Jackie Washington MRN: [id number] DOB:02/06/1961, 63 y.o., female Today's Date: 09/10/2023   END OF SESSION:  PT End of Session - 09/10/23 0930     Visit Number 7    Date for PT Re-Evaluation 09/20/23    Authorization Type [employer] - [facility]    PT Start Time 0930    PT Stop Time 1012    PT Time Calculation (min) 42 min    Activity Tolerance Patient tolerated treatment well    Behavior During Therapy WFL for tasks assessed/performed                 Past Medical History:  Diagnosis Date   Fatigue    Goiter diffuse, nontoxic    History of basal cell carcinoma    Hyperlipidemia    Hypertension    Iron deficiency anemia    Prediabetes    Vaginal neoplasm benign    Vitamin D deficiency    Past Surgical History:  Procedure Laterality Date   ABDOMINAL HYSTERECTOMY     APPENDECTOMY     BREAST CYST ASPIRATION     8 years ago unsure of laterality    KNEE SURGERY     Patient Active Problem List   Diagnosis Date Noted   Mild dilation of ascending aorta (HCC) 04/10/2023   Elevated coronary artery calcium  score 04/10/2023   Prediabetes 03/29/2023   Mixed hyperlipidemia 03/29/2023   Essential hypertension 03/29/2023   PVCs (premature ventricular contractions) 03/29/2023   Acquired hypothyroidism 03/29/2023    PCP: Ina Marcellus RAMAN, MD   REFERRING PROVIDER: Larnell Purchase, MD   REFERRING DIAG: M75.02 (ICD-10-CM) - Adhesive capsulitis of left shoulder   THERAPY DIAG:  Acute pain of both shoulders  Abnormal posture  Muscle weakness (generalized)  RATIONALE FOR EVALUATION AND TREATMENT: Rehabilitation  ONSET DATE: end of August 2024  NEXT MD VISIT: TBD   SUBJECTIVE:                                                                                                                                                                                                         SUBJECTIVE STATEMENT: Pt reports she was  able to pick up stacks of plates over the holidays w/o any pain.  PAIN: Are you having pain? No  - L upper arm/shoulder   Are you having pain? Yes: NPRS scale: 0/10 at rest with reaching across chest 2/10 Pain location: R anterior upper arm/shoulder  Pain description: dull  Aggravating factors: reaching across chest Relieving factors: rest, cease the triggering  activity  PERTINENT HISTORY:  HTN, fatigue, prediabetes, hypothyroidism, knee surgery, basal cell carcinoma, HLD  PRECAUTIONS: None  RED FLAGS: None  HAND DOMINANCE: Right  WEIGHT BEARING RESTRICTIONS: No  FALLS:  Has patient fallen in last 6 months? No  LIVING ENVIRONMENT: Lives with: lives with their spouse Lives in: House/apartment  OCCUPATION: Home maker  PLOF: Independent and Leisure: swimming, walking the dogs, reading  PATIENT GOALS: To lessen the pain with daily activities (and ADLs).   OBJECTIVE: (objective measures completed at initial evaluation unless otherwise dated)  DIAGNOSTIC FINDINGS:  None available  PATIENT SURVEYS:  Quick Dash 20.5 / 100 = 20.5 %  COGNITION: Overall cognitive status: Within functional limits for tasks assessed     SENSATION: WFL  POSTURE: rounded shoulders and forward head  UPPER EXTREMITY ROM:  B shoulder PROM WNL with some discomfort noted with L shoulder flexion & ER at end ROM  Active ROM Right eval Left eval R 08/23/23 L 08/23/23  Shoulder flexion 153 * 142 * 177 174  Shoulder extension 24 * 38 * 34 44  Shoulder abduction 132 * 125 * 177 162  Shoulder adduction      Shoulder internal rotation FIR to L1* FIR to L1* FIR T10 FIR T8  Shoulder external rotation FER to upper scapula* FER to upper scapula* FER T4 FER T4  Elbow flexion      Elbow extension      Wrist flexion      Wrist extension      Wrist ulnar deviation      Wrist radial deviation      Wrist pronation      Wrist supination      (Blank rows = not tested, * - discomfort)  UPPER  EXTREMITY MMT:  MMT Right eval Left eval R 08/26/23 L 08/26/23  Shoulder flexion 4- ^ 3+  4+ 4+  Shoulder extension 4 ^ 4 4+ 4+  Shoulder abduction 4- ^ 3+ ^ 4 4+  Shoulder adduction      Shoulder internal rotation 4 ^ 4+  4+ 5  Shoulder external rotation 4 ^ 4+ * 4 4+  Middle trapezius   4 4-  Lower trapezius   4 5  Elbow flexion      Elbow extension      Wrist flexion      Wrist extension      Wrist ulnar deviation      Wrist radial deviation      Wrist pronation      Wrist supination      Grip strength (lbs)      (Blank rows = not tested, ^ = pain, * - discomfort)  SHOULDER SPECIAL TESTS: Impingement tests: Neer impingement test: negative and Hawkins/Kennedy impingement test: positive  on L Rotator cuff assessment: Empty can test: negative and Full can test: negative  JOINT MOBILITY TESTING:  WFL  PALPATION:  TTP over R pec minor    TODAY'S TREATMENT:   09/10/23 THERAPEUTIC EXERCISE: to improve flexibility, strength and mobility.  Demonstration, verbal and tactile cues throughout for technique.  UBE L3.0 x 6 min (3' each fwd & back) Prone over green Pball - I's, T's, Y's & W's 1# bil x 10 each BATCA lat pull down 20# 2 x 10 - cues to keep scapular muscles engaged in retraction & depression Standing RTB R/L shoulder D1 & D2 flexion & extension diagonals x 10 each  MANUAL THERAPY: To promote normalized muscle tension, improved flexibility, improved joint mobility, increased  ROM, and reduced pain. Skilled palpation and monitoring of soft tissue during DN STM/DTM, manual TPR and pin & stretch to muscles addressed with DN as well as DTM and manual TPR to R lateral subscapularis & teres group Trigger Point Dry-Needling  Treatment instructions: Expect mild to moderate muscle soreness. S/S of pneumothorax if dry needled over a lung field, and to seek immediate medical attention should they occur. Patient verbalized understanding of these instructions and education. Patient  Consent Given: Yes Education handout provided: Previously provided Muscles treated: R anterior and lateral deltoid Electrical stimulation performed: No Parameters: N/A Treatment response/outcome: Twitch Response Elicited and Palpable Increase in Muscle Length; Pt reporting decreased pain with cross-body reach   08/26/23 THERAPEUTIC EXERCISE: to improve flexibility, strength and mobility.  Demonstration, verbal and tactile cues throughout for technique.  Nustep L5x21min UE/LE UE MMT Prone mid row + scap retraction x 10; 2lb x 10  B ER YTB wrapped around wrist x 10 - 5 second hold Lower trap raises with RTB around wrist 2x10 Wall push up x 20 - cues to depress her scapulae Review and update of HEP   08/23/23 THERAPEUTIC EXERCISE: to improve flexibility, strength and mobility.  Demonstration, verbal and tactile cues throughout for technique.  UBE L2.5 x 6 min (3' each fwd & back) Prone over green Pball - I's, T's, Y's & W's x 10 B lower trap setting - Y wall slide with looped YTB at wrists + slight lift off wall x 10 - cues to avoid pulling arms back past head Wall push-up plus x 10 B serratus wall clocks with looped YTB at wrists x 10 Serratus roll-ups on 6 FR with looped YTB at wrists x 10 R shoulder FIR stretch/AAROM with towel x 10  THERAPEUTIC ACTIVITIES:  B shoulder ROM assessment   PATIENT EDUCATION:  Education details: continue with current HEP, role of DN, and DN rational, procedure, outcomes, potential side effects, and recommended post-treatment exercises/activity  Person educated: Patient Education method: Explanation and Handouts Education comprehension: verbalized understanding  HOME EXERCISE PROGRAM: Access Code: [email or web address] URL: [email or web address] Date: 08/26/2023 Prepared by: Braylin Clark  Exercises - Doorway Pec Stretch at 60 Degrees Abduction with Arm Straight  - 2-3 x daily - 7 x weekly - 3 reps - 30 sec hold - Single Arm Doorway Pec  Stretch at 60 Elevation  - 2-3 x daily - 7 x weekly - 3 reps - 30 sec hold - Seated Scapular Retraction  - 2 x daily - 7 x weekly - 2 sets - 10 reps - 3-5 sec hold - Scapular Retraction with Resistance Advanced  - 1 x daily - 7 x weekly - 2 sets - 10 reps - 5 sec hold - Standing Bilateral Low Shoulder Row with Anchored Resistance  - 1 x daily - 7 x weekly - 2 sets - 10 reps - 5 sec hold - Shoulder External Rotation and Scapular Retraction with Resistance  - 1 x daily - 7 x weekly - 2 sets - 10 reps - Shoulder Flexion Serratus Activation with Resistance  - 1 x daily - 3 x weekly - 2 sets - 10 reps - Prone Scapular Retraction and Row  - 1 x daily - 3 x weekly - 2 sets - 10 reps  Patient Education - Trigger Point Dry Needling - Kinesiology tape   ASSESSMENT:  CLINICAL IMPRESSION: Leshawn [name] reports 80% improvement in pain with no pain/sleep disturbance at night and R shoulder pain only with certain movements such  as reaching across her chest during the day.  Increased muscle tension/taut band noted in R anterior and lateral deltoids which was addressed with MT incorporating TPDN with good twitch responses elicited resulting in palpable reduction in muscle tension and reports of decreased pain with cross-body reach. Continued progression of scapular strengthening and introduced shoulder diagonals to work on cross-body motions.  Mild discomfort noted with some motions but not enough to cause her to need to stop exercises.  Rollene will benefit from continued skilled PT to address ongoing deficits to improve mobility and activity tolerance with decreased pain interference.   OBJECTIVE IMPAIRMENTS: decreased activity tolerance, decreased knowledge of condition, decreased ROM, decreased strength, hypomobility, increased fascial restrictions, impaired perceived functional ability, increased muscle spasms, impaired flexibility, impaired UE functional use, improper body mechanics, postural  dysfunction, and pain.   ACTIVITY LIMITATIONS: carrying, lifting, sleeping, bathing, dressing, reach over head, hygiene/grooming, and caring for others  PARTICIPATION LIMITATIONS: meal prep, cleaning, laundry, shopping, community activity, and yard work  PERSONAL FACTORS: Past/current experiences, Time since onset of injury/illness/exacerbation, and 3+ comorbidities: HTN, fatigue, prediabetes, hypothyroidism, knee surgery, basal cell carcinoma, HLD  are also affecting patient's functional outcome.   REHAB POTENTIAL: Good  CLINICAL DECISION MAKING: Evolving/moderate complexity  EVALUATION COMPLEXITY: Moderate   GOALS: Goals reviewed with patient? Yes  SHORT TERM GOALS: Target date: 08/23/2023   Patient will be independent with initial HEP to improve outcomes and carryover.  Baseline:  Goal status: MET - 08/23/23  LONG TERM GOALS: Target date: 09/20/2023  Patient will be independent with ongoing/advanced HEP for self-management at home.  Baseline:  Goal status: IN PROGRESS - 09/10/23 - Met for current HEP  2.  Patient will report 50-75% improvement in B shoulder pain to improve QOL.  Baseline: L shoulder up to 2/10 since injection, R shoulder up to 3/10 Goal status: IN PROGRESS - 09/10/23 - 80% improvement  3.  Patient to demonstrate improved upright posture with posterior shoulder girdle engaged to promote improved glenohumeral joint mobility. Baseline: fwd head and rounded shoulder posture Goal status: IN PROGRESS  4.  Patient to improve B shoulder AROM to WFL/WNL without pain provocation to allow for increased ease of ADLs.  Baseline: Refer to above UE ROM table Goal status: PARTIALLY MET - 08/23/23 - Met except slightly limited in R shoulder FIR  5.  Patient will demonstrate improved B shoulder strength to >/= 4 to 4+/5 for functional UE use. Baseline: Refer to above UE MMT table Goal status: IN PROGRESS - 08/26/23  6  Patient will report </= 11% on QuickDASH to demonstrate  improved functional ability.  Baseline: 20.5 / 100 = 20.5 % Goal status: IN PROGRESS  7.  Patient will report no sleep disturbance due to B shoulder pain.   Baseline: difficulty finding a comfortable sleeping position d/t B shoulder pain Goal status: MET - 09/10/23  8. Patient to report ability to perform ADLs, household, and leisure activities without limitation due to increased shoulder pain, LOM or weakness. Baseline: difficulty lifting heavy items like groceries, putting a bra on, reaching for the seat belt, and with jerking motions like starting the leaf blower Goal status: MET - 09/10/23   PLAN:  PT FREQUENCY: 2x/week  PT DURATION: 8 weeks  PLANNED INTERVENTIONS: 97164- PT Re-evaluation, 97110-Therapeutic exercises, 97530- Therapeutic activity, 97112- Neuromuscular re-education, 97535- Self Care, [id number]- Manual therapy, 97014- Electrical stimulation (unattended), [id number]- Electrical stimulation (manual), 97016- Vasopneumatic device, [id number]- Ultrasound, [id number]- Ionotophoresis 4mg /ml Dexamethasone, Patient/Family education, Taping, Dry Needling, Joint mobilization,  Spinal mobilization, Cryotherapy, and Moist heat  PLAN FOR NEXT SESSION: Assess response to DN; progress postural stretching and scapular strengthening/stabilization, RTC & shoulder diagonal strengthening;  MT +/- DN as indicated to address abnormal muscle tension/tightness and pain    Elijah CHRISTELLA Hidden, PT 09/10/2023, 10:17 AM

## 2023-09-13 ENCOUNTER — Ambulatory Visit: Payer: Commercial Managed Care - PPO

## 2023-09-16 ENCOUNTER — Ambulatory Visit: Payer: Commercial Managed Care - PPO

## 2023-09-16 DIAGNOSIS — M25512 Pain in left shoulder: Secondary | ICD-10-CM | POA: Diagnosis not present

## 2023-09-16 DIAGNOSIS — M25511 Pain in right shoulder: Secondary | ICD-10-CM

## 2023-09-16 DIAGNOSIS — R293 Abnormal posture: Secondary | ICD-10-CM

## 2023-09-16 DIAGNOSIS — M6281 Muscle weakness (generalized): Secondary | ICD-10-CM

## 2023-09-16 NOTE — Therapy (Signed)
 OUTPATIENT PHYSICAL THERAPY TREATMENT   Patient Name: Jackie Washington MRN: [id number] DOB:1960-12-09, 63 y.o., female Today's Date: 09/16/2023   END OF SESSION:  PT End of Session - 09/16/23 1113     Visit Number 8    Date for PT Re-Evaluation 09/20/23    Authorization Type [employer] - [facility]    PT Start Time 1022    PT Stop Time 1106    PT Time Calculation (min) 44 min    Activity Tolerance Patient tolerated treatment well    Behavior During Therapy WFL for tasks assessed/performed                  Past Medical History:  Diagnosis Date   Fatigue    Goiter diffuse, nontoxic    History of basal cell carcinoma    Hyperlipidemia    Hypertension    Iron deficiency anemia    Prediabetes    Vaginal neoplasm benign    Vitamin D deficiency    Past Surgical History:  Procedure Laterality Date   ABDOMINAL HYSTERECTOMY     APPENDECTOMY     BREAST CYST ASPIRATION     8 years ago unsure of laterality    KNEE SURGERY     Patient Active Problem List   Diagnosis Date Noted   Mild dilation of ascending aorta (HCC) 04/10/2023   Elevated coronary artery calcium  score 04/10/2023   Prediabetes 03/29/2023   Mixed hyperlipidemia 03/29/2023   Essential hypertension 03/29/2023   PVCs (premature ventricular contractions) 03/29/2023   Acquired hypothyroidism 03/29/2023    PCP: Ina Marcellus RAMAN, MD   REFERRING PROVIDER: Larnell Purchase, MD   REFERRING DIAG: M75.02 (ICD-10-CM) - Adhesive capsulitis of left shoulder   THERAPY DIAG:  Acute pain of both shoulders  Abnormal posture  Muscle weakness (generalized)  RATIONALE FOR EVALUATION AND TREATMENT: Rehabilitation  ONSET DATE: end of August 2024  NEXT MD VISIT: TBD   SUBJECTIVE:                                                                                                                                                                                                         SUBJECTIVE STATEMENT: Pt reports very mild  pain in R shoulder.   PAIN: Are you having pain? No  - L upper arm/shoulder   Are you having pain? Yes: NPRS scale: 1/10 Pain location: R anterior upper arm/shoulder  Pain description: dull  Aggravating factors: reaching across chest Relieving factors: rest, cease the triggering activity  PERTINENT HISTORY:  HTN, fatigue, prediabetes, hypothyroidism, knee surgery, basal cell carcinoma,  HLD  PRECAUTIONS: None  RED FLAGS: None  HAND DOMINANCE: Right  WEIGHT BEARING RESTRICTIONS: No  FALLS:  Has patient fallen in last 6 months? No  LIVING ENVIRONMENT: Lives with: lives with their spouse Lives in: House/apartment  OCCUPATION: Home maker  PLOF: Independent and Leisure: swimming, walking the dogs, reading  PATIENT GOALS: To lessen the pain with daily activities (and ADLs).   OBJECTIVE: (objective measures completed at initial evaluation unless otherwise dated)  DIAGNOSTIC FINDINGS:  None available  PATIENT SURVEYS:  Quick Dash 20.5 / 100 = 20.5 %  COGNITION: Overall cognitive status: Within functional limits for tasks assessed     SENSATION: WFL  POSTURE: rounded shoulders and forward head  UPPER EXTREMITY ROM:  B shoulder PROM WNL with some discomfort noted with L shoulder flexion & ER at end ROM  Active ROM Right eval Left eval R 08/23/23 L 08/23/23  Shoulder flexion 153 * 142 * 177 174  Shoulder extension 24 * 38 * 34 44  Shoulder abduction 132 * 125 * 177 162  Shoulder adduction      Shoulder internal rotation FIR to L1* FIR to L1* FIR T10 FIR T8  Shoulder external rotation FER to upper scapula* FER to upper scapula* FER T4 FER T4  Elbow flexion      Elbow extension      Wrist flexion      Wrist extension      Wrist ulnar deviation      Wrist radial deviation      Wrist pronation      Wrist supination      (Blank rows = not tested, * - discomfort)  UPPER EXTREMITY MMT:  MMT Right eval Left eval R 08/26/23 L 08/26/23 R 09/16/23 L 09/16/23   Shoulder flexion 4- ^ 3+  4+ 4+ 5 5  Shoulder extension 4 ^ 4 4+ 4+    Shoulder abduction 4- ^ 3+ ^ 4 4+ 5 4+  Shoulder adduction        Shoulder internal rotation 4 ^ 4+  4+ 5 4+ 5  Shoulder external rotation 4 ^ 4+ * 4 4+ 4+ 5  Middle trapezius   4 4- 4+ 4+  Lower trapezius   4 5 4+ 4+  Elbow flexion        Elbow extension        Wrist flexion        Wrist extension        Wrist ulnar deviation        Wrist radial deviation        Wrist pronation        Wrist supination        Grip strength (lbs)        (Blank rows = not tested, ^ = pain, * - discomfort)  SHOULDER SPECIAL TESTS: Impingement tests: Neer impingement test: negative and Hawkins/Kennedy impingement test: positive  on L Rotator cuff assessment: Empty can test: negative and Full can test: negative  JOINT MOBILITY TESTING:  WFL  PALPATION:  TTP over R pec minor    TODAY'S TREATMENT:  09/16/23 THERAPEUTIC EXERCISE: to improve flexibility, strength and mobility.  Demonstration, verbal and tactile cues throughout for technique.  UBE L2.5 x 6 min (3' each fwd & back) UE MMT Serratus slide with GTB 2x10 B standing ER GTB 2x10 Seated mid row 15# 2x10 MANUAL THERAPY: To promote normalized muscle tension, improved flexibility, improved joint mobility, increased ROM, and reduced pain. STM to L anterior deltoid  MWM into ER and IR  09/10/23 THERAPEUTIC EXERCISE: to improve flexibility, strength and mobility.  Demonstration, verbal and tactile cues throughout for technique.  UBE L3.0 x 6 min (3' each fwd & back) Prone over green Pball - I's, T's, Y's & W's 1# bil x 10 each BATCA lat pull down 20# 2 x 10 - cues to keep scapular muscles engaged in retraction & depression Standing RTB R/L shoulder D1 & D2 flexion & extension diagonals x 10 each  MANUAL THERAPY: To promote normalized muscle tension, improved flexibility, improved joint mobility, increased ROM, and reduced pain. Skilled palpation and monitoring of soft tissue  during DN STM/DTM, manual TPR and pin & stretch to muscles addressed with DN as well as DTM and manual TPR to R lateral subscapularis & teres group Trigger Point Dry-Needling  Treatment instructions: Expect mild to moderate muscle soreness. S/S of pneumothorax if dry needled over a lung field, and to seek immediate medical attention should they occur. Patient verbalized understanding of these instructions and education. Patient Consent Given: Yes Education handout provided: Previously provided Muscles treated: R anterior and lateral deltoid Electrical stimulation performed: No Parameters: N/A Treatment response/outcome: Twitch Response Elicited and Palpable Increase in Muscle Length; Pt reporting decreased pain with cross-body reach   08/26/23 THERAPEUTIC EXERCISE: to improve flexibility, strength and mobility.  Demonstration, verbal and tactile cues throughout for technique.  Nustep L5x28min UE/LE UE MMT Prone mid row + scap retraction x 10; 2lb x 10  B ER YTB wrapped around wrist x 10 - 5 second hold Lower trap raises with RTB around wrist 2x10 Wall push up x 20 - cues to depress her scapulae Review and update of HEP   08/23/23 THERAPEUTIC EXERCISE: to improve flexibility, strength and mobility.  Demonstration, verbal and tactile cues throughout for technique.  UBE L2.5 x 6 min (3' each fwd & back) Prone over green Pball - I's, T's, Y's & W's x 10 B lower trap setting - Y wall slide with looped YTB at wrists + slight lift off wall x 10 - cues to avoid pulling arms back past head Wall push-up plus x 10 B serratus wall clocks with looped YTB at wrists x 10 Serratus roll-ups on 6 FR with looped YTB at wrists x 10 R shoulder FIR stretch/AAROM with towel x 10  THERAPEUTIC ACTIVITIES:  B shoulder ROM assessment   PATIENT EDUCATION:  Education details: continue with current HEP, role of DN, and DN rational, procedure, outcomes, potential side effects, and recommended post-treatment  exercises/activity  Person educated: Patient Education method: Explanation and Handouts Education comprehension: verbalized understanding  HOME EXERCISE PROGRAM: Access Code: [email or web address] URL: [email or web address] Date: 08/26/2023 Prepared by: Treyven Lafauci  Exercises - Doorway Pec Stretch at 60 Degrees Abduction with Arm Straight  - 2-3 x daily - 7 x weekly - 3 reps - 30 sec hold - Single Arm Doorway Pec Stretch at 60 Elevation  - 2-3 x daily - 7 x weekly - 3 reps - 30 sec hold - Seated Scapular Retraction  - 2 x daily - 7 x weekly - 2 sets - 10 reps - 3-5 sec hold - Scapular Retraction with Resistance Advanced  - 1 x daily - 7 x weekly - 2 sets - 10 reps - 5 sec hold - Standing Bilateral Low Shoulder Row with Anchored Resistance  - 1 x daily - 7 x weekly - 2 sets - 10 reps - 5 sec hold - Shoulder External Rotation and Scapular Retraction with Resistance  -  1 x daily - 7 x weekly - 2 sets - 10 reps - Shoulder Flexion Serratus Activation with Resistance  - 1 x daily - 3 x weekly - 2 sets - 10 reps - Prone Scapular Retraction and Row  - 1 x daily - 3 x weekly - 2 sets - 10 reps  Patient Education - Trigger Point Dry Needling - Kinesiology tape   ASSESSMENT:  CLINICAL IMPRESSION: Pt responded well to treatment. We continued working on shoulder stabilization and RTC strengthening. Strength test show great improvement and meet LTG #5. During the session she reported feeling some pain along her anterior deltoid area, which was addressed with MT. She shows great improvement, seems to be ready to wrap up PT in the upcoming visit.   OBJECTIVE IMPAIRMENTS: decreased activity tolerance, decreased knowledge of condition, decreased ROM, decreased strength, hypomobility, increased fascial restrictions, impaired perceived functional ability, increased muscle spasms, impaired flexibility, impaired UE functional use, improper body mechanics, postural dysfunction, and pain.   ACTIVITY  LIMITATIONS: carrying, lifting, sleeping, bathing, dressing, reach over head, hygiene/grooming, and caring for others  PARTICIPATION LIMITATIONS: meal prep, cleaning, laundry, shopping, community activity, and yard work  PERSONAL FACTORS: Past/current experiences, Time since onset of injury/illness/exacerbation, and 3+ comorbidities: HTN, fatigue, prediabetes, hypothyroidism, knee surgery, basal cell carcinoma, HLD  are also affecting patient's functional outcome.   REHAB POTENTIAL: Good  CLINICAL DECISION MAKING: Evolving/moderate complexity  EVALUATION COMPLEXITY: Moderate   GOALS: Goals reviewed with patient? Yes  SHORT TERM GOALS: Target date: 08/23/2023   Patient will be independent with initial HEP to improve outcomes and carryover.  Baseline:  Goal status: MET - 08/23/23  LONG TERM GOALS: Target date: 09/20/2023  Patient will be independent with ongoing/advanced HEP for self-management at home.  Baseline:  Goal status: IN PROGRESS - 09/10/23 - Met for current HEP  2.  Patient will report 50-75% improvement in B shoulder pain to improve QOL.  Baseline: L shoulder up to 2/10 since injection, R shoulder up to 3/10 Goal status: IN PROGRESS - 09/10/23 - 80% improvement  3.  Patient to demonstrate improved upright posture with posterior shoulder girdle engaged to promote improved glenohumeral joint mobility. Baseline: fwd head and rounded shoulder posture Goal status: IN PROGRESS  4.  Patient to improve B shoulder AROM to WFL/WNL without pain provocation to allow for increased ease of ADLs.  Baseline: Refer to above UE ROM table Goal status: PARTIALLY MET - 08/23/23 - Met except slightly limited in R shoulder FIR  5.  Patient will demonstrate improved B shoulder strength to >/= 4 to 4+/5 for functional UE use. Baseline: Refer to above UE MMT table Goal status: MET - 09/16/23  6  Patient will report </= 11% on QuickDASH to demonstrate improved functional ability.  Baseline: 20.5  / 100 = 20.5 % Goal status: IN PROGRESS  7.  Patient will report no sleep disturbance due to B shoulder pain.   Baseline: difficulty finding a comfortable sleeping position d/t B shoulder pain Goal status: MET - 09/10/23  8. Patient to report ability to perform ADLs, household, and leisure activities without limitation due to increased shoulder pain, LOM or weakness. Baseline: difficulty lifting heavy items like groceries, putting a bra on, reaching for the seat belt, and with jerking motions like starting the leaf blower Goal status: MET - 09/10/23   PLAN:  PT FREQUENCY: 2x/week  PT DURATION: 8 weeks  PLANNED INTERVENTIONS: 97164- PT Re-evaluation, 97110-Therapeutic exercises, 97530- Therapeutic activity, [id number]- Neuromuscular re-education, 97535- Self  Care, [id number]- Manual therapy, 97014- Electrical stimulation (unattended), [phone]- Electrical stimulation (manual), [id number]- Vasopneumatic device, [id number]- Ultrasound, [id number]- Ionotophoresis 4mg /ml Dexamethasone, Patient/Family education, Taping, Dry Needling, Joint mobilization, Spinal mobilization, Cryotherapy, and Moist heat  PLAN FOR NEXT SESSION: progress postural stretching and scapular strengthening/stabilization, RTC & shoulder diagonal strengthening;  MT +/- DN as indicated to address abnormal muscle tension/tightness and pain    Breea Loncar L Rodolphe Edmonston, PTA 09/16/2023, 11:14 AM

## 2023-09-20 ENCOUNTER — Encounter: Payer: Self-pay | Admitting: Physical Therapy

## 2023-09-20 ENCOUNTER — Ambulatory Visit: Payer: Commercial Managed Care - PPO | Admitting: Physical Therapy

## 2023-09-20 DIAGNOSIS — R293 Abnormal posture: Secondary | ICD-10-CM | POA: Diagnosis not present

## 2023-09-20 DIAGNOSIS — M25511 Pain in right shoulder: Secondary | ICD-10-CM | POA: Diagnosis not present

## 2023-09-20 DIAGNOSIS — M6281 Muscle weakness (generalized): Secondary | ICD-10-CM

## 2023-09-20 DIAGNOSIS — M25512 Pain in left shoulder: Secondary | ICD-10-CM | POA: Diagnosis not present

## 2023-09-20 NOTE — Therapy (Addendum)
 OUTPATIENT PHYSICAL THERAPY TREATMENT / DISCHARGE SUMMARY  Progress Note  Reporting Period 07/26/2023 to 09/20/2023   See note below for Objective Data and Assessment of Progress/Goals.     Patient Name: Jackie Washington MRN: [id number] DOB:[birth year], 63 y.o., female Today's Date: 09/20/2023   END OF SESSION:  PT End of Session - 09/20/23 1018     Visit Number 9    Date for PT Re-Evaluation 09/20/23    Authorization Type [employer] - [facility]    PT Start Time 1018    PT Stop Time 1057    PT Time Calculation (min) 39 min    Activity Tolerance Patient tolerated treatment well    Behavior During Therapy WFL for tasks assessed/performed                   Past Medical History:  Diagnosis Date   Fatigue    Goiter diffuse, nontoxic    History of basal cell carcinoma    Hyperlipidemia    Hypertension    Iron deficiency anemia    Prediabetes    Vaginal neoplasm benign    Vitamin D deficiency    Past Surgical History:  Procedure Laterality Date   ABDOMINAL HYSTERECTOMY     APPENDECTOMY     BREAST CYST ASPIRATION     8 years ago unsure of laterality    KNEE SURGERY     Patient Active Problem List   Diagnosis Date Noted   Mild dilation of ascending aorta (HCC) 04/10/2023   Elevated coronary artery calcium  score 04/10/2023   Prediabetes 03/29/2023   Mixed hyperlipidemia 03/29/2023   Essential hypertension 03/29/2023   PVCs (premature ventricular contractions) 03/29/2023   Acquired hypothyroidism 03/29/2023    PCP: Gaither Juba, MD   REFERRING PROVIDER: Marcene Serve, MD   REFERRING DIAG: M75.02 (ICD-10-CM) - Adhesive capsulitis of left shoulder   THERAPY DIAG:  Acute pain of both shoulders  Abnormal posture  Muscle weakness (generalized)  RATIONALE FOR EVALUATION AND TREATMENT: Rehabilitation  ONSET DATE: end of August 2024  NEXT MD VISIT: TBD   SUBJECTIVE:                                                                                                                                                                                                          SUBJECTIVE STATEMENT: Pt reports her R shoulder/upper arm pain comes and goes - still most common with reaching across her chest and only 1-2/10 in intensity.  No recent L shoulder pain.   PAIN: Are you having pain? No  -  L upper arm/shoulder   Are you having pain? Yes: NPRS scale: 0/10 currently, up to 1-2/10 Pain location: R anterior upper arm/shoulder  Pain description: dull  Aggravating factors: reaching across chest Relieving factors: rest, cease the triggering activity, hot tub, massage  PERTINENT HISTORY:  HTN, fatigue, prediabetes, hypothyroidism, knee surgery, basal cell carcinoma, HLD  PRECAUTIONS: None  RED FLAGS: None  HAND DOMINANCE: Right  WEIGHT BEARING RESTRICTIONS: No  FALLS:  Has patient fallen in last 6 months? No  LIVING ENVIRONMENT: Lives with: lives with their spouse Lives in: House/apartment  OCCUPATION: Home maker  PLOF: Independent and Leisure: swimming, walking the dogs, reading  PATIENT GOALS: "To lessen the pain with daily activities (and ADLs)."   OBJECTIVE: (objective measures completed at initial evaluation unless otherwise dated)  DIAGNOSTIC FINDINGS:  None available  PATIENT SURVEYS:  Quick Dash 20.5 / 100 = 20.5 % 09/20/23: 6.8 / 100 = 6.8 %  COGNITION: Overall cognitive status: Within functional limits for tasks assessed     SENSATION: WFL  POSTURE: rounded shoulders and forward head  UPPER EXTREMITY ROM:  B shoulder PROM WNL with some discomfort noted with L shoulder flexion & ER at end ROM  Active ROM Right eval Left eval R 08/23/23 L 08/23/23 R 09/20/23 L 09/20/23  Shoulder flexion 153 * 142 * 177 174 WNL WNL  Shoulder extension 24 * 38 * 34 44 WNL WNL  Shoulder abduction 132 * 125 * 177 162 WNL WNL  Shoulder adduction     WNL * WNL  Shoulder internal rotation FIR to L1* FIR to L1* FIR T10 FIR T8 WFL * WNL   Shoulder external rotation FER to upper scapula* FER to upper scapula* FER T4 FER T4 WNL WNL  Elbow flexion        Elbow extension        Wrist flexion        Wrist extension        Wrist ulnar deviation        Wrist radial deviation        Wrist pronation        Wrist supination        (Blank rows = not tested, * - discomfort)  UPPER EXTREMITY MMT:  MMT Right eval Left eval R 08/26/23 L 08/26/23 R 09/16/23 L 09/16/23  Shoulder flexion 4- ^ 3+  4+ 4+ 5 5  Shoulder extension 4 ^ 4 4+ 4+    Shoulder abduction 4- ^ 3+ ^ 4 4+ 5 4+  Shoulder adduction        Shoulder internal rotation 4 ^ 4+  4+ 5 4+ 5  Shoulder external rotation 4 ^ 4+ * 4 4+ 4+ 5  Middle trapezius   4 4- 4+ 4+  Lower trapezius   4 5 4+ 4+  Elbow flexion        Elbow extension        Wrist flexion        Wrist extension        Wrist ulnar deviation        Wrist radial deviation        Wrist pronation        Wrist supination        Grip strength (lbs)        (Blank rows = not tested, ^ = pain, * - discomfort)  SHOULDER SPECIAL TESTS: Impingement tests: Neer impingement test: negative and Hawkins/Kennedy impingement test: positive  on L Rotator cuff assessment:  Empty can test: negative and Full can test: negative  JOINT MOBILITY TESTING:  WFL  PALPATION:  TTP over R pec minor    TODAY'S TREATMENT:   09/20/23 THERAPEUTIC EXERCISE: to improve flexibility, strength and mobility.  Demonstration, verbal and tactile cues throughout for technique.  UBE L3.0 x 6 min (3' each fwd & back) HEP review & update  THERAPEUTIC ACTIVITIES: QuickDASH: 6.8 / 100 = 6.8 % Goal assessment  SELF CARE: Review of postural awareness and neutral spine and shoulder posture   09/16/23 THERAPEUTIC EXERCISE: to improve flexibility, strength and mobility.  Demonstration, verbal and tactile cues throughout for technique.  UBE L2.5 x 6 min (3' each fwd & back) UE MMT Serratus slide with GTB 2x10 B standing ER GTB 2x10 Seated  mid row 15# 2x10 MANUAL THERAPY: To promote normalized muscle tension, improved flexibility, improved joint mobility, increased ROM, and reduced pain. STM to L anterior deltoid MWM into ER and IR   09/10/23 THERAPEUTIC EXERCISE: to improve flexibility, strength and mobility.  Demonstration, verbal and tactile cues throughout for technique.  UBE L3.0 x 6 min (3' each fwd & back) Prone over green Pball - I's, T's, Y's & W's 1# bil x 10 each BATCA lat pull down 20# 2 x 10 - cues to keep scapular muscles engaged in retraction & depression Standing RTB R/L shoulder D1 & D2 flexion & extension diagonals x 10 each  MANUAL THERAPY: To promote normalized muscle tension, improved flexibility, improved joint mobility, increased ROM, and reduced pain. Skilled palpation and monitoring of soft tissue during DN STM/DTM, manual TPR and pin & stretch to muscles addressed with DN as well as DTM and manual TPR to R lateral subscapularis & teres group Trigger Point Dry-Needling  Treatment instructions: Expect mild to moderate muscle soreness. S/S of pneumothorax if dry needled over a lung field, and to seek immediate medical attention should they occur. Patient verbalized understanding of these instructions and education. Patient Consent Given: Yes Education handout provided: Previously provided Muscles treated: R anterior and lateral deltoid Electrical stimulation performed: No Parameters: N/A Treatment response/outcome: Twitch Response Elicited and Palpable Increase in Muscle Length; Pt reporting decreased pain with cross-body reach   PATIENT EDUCATION:  Education details: HEP review, HEP update, and recommended frequency for ongoing HEP at discharge to prevent loss of gains achieved with PT  Person educated: Patient Education method: Explanation and Handouts Education comprehension: verbalized understanding  HOME EXERCISE PROGRAM: Access Code: [email or web address] URL: [email or web address] Date:  09/20/2023 Prepared by: Felecia Hopper  Exercises - Doorway Pec Stretch at 60 Degrees Abduction with Arm Straight  - 2-3 x daily - 7 x weekly - 3 reps - 30 sec hold - Single Arm Doorway Pec Stretch at 60 Elevation  - 2-3 x daily - 7 x weekly - 3 reps - 30 sec hold - Seated Scapular Retraction  - 2 x daily - 7 x weekly - 2 sets - 10 reps - 3-5 sec hold - Scapular Retraction with Resistance Advanced  - 1 x daily - 7 x weekly - 2 sets - 10 reps - 5 sec hold - Standing Bilateral Low Shoulder Row with Anchored Resistance  - 1 x daily - 7 x weekly - 2 sets - 10 reps - 5 sec hold - Shoulder External Rotation and Scapular Retraction with Resistance  - 1 x daily - 7 x weekly - 2 sets - 10 reps - Shoulder Flexion Serratus Activation with Resistance  - 1 x daily - 3 x  weekly - 2 sets - 10 reps - Prone Scapular Retraction and Row  - 1 x daily - 3 x weekly - 2 sets - 10 reps - Standing Low Trap Setting with Resistance at Wall  - 1 x daily - 3 x weekly - 2 sets - 10 reps - 3 sec hold - Prone Shoulder Extension on Swiss Ball  - 1 x daily - 3 x weekly - 2 sets - 10 reps - 3 sec hold - Prone Middle Trapezius Strengthening on Swiss Ball  - 1 x daily - 3 x weekly - 2 sets - 10 reps - 3 sec hold - Prone Lower Trapezius Strengthening on Swiss Ball  - 1 x daily - 3 x weekly - 2 sets - 10 reps - 3 sec hold - Prone Shoulder W on Swiss Ball  - 1 x daily - 3 x weekly - 2 sets - 10 reps - 3 sec hold - Correct Standing Posture   Patient Education - Trigger Point Dry Needling - Kinesiology tape   ASSESSMENT:  CLINICAL IMPRESSION: Joanmarie "Daivd Dub" is pleased with her progress and reports her L shoulder pain is now resolved and only reports occasional mild (1-2/10) R shoulder/upper arm pain, most common with reaching across her body.  B shoulder ROM now WNL and strength 4+ to 5/5.  Functionally she denies any limitations and reports minimal to no pain/sleep disturbance at night, with QuickDASH now reflecting only  6.8% disability.  All PT goals now met and Daivd Dub feels ready to transition to her HEP but would like to remain on hold for 30-days in the event that issues arise that would necessitate a return to PT.  OBJECTIVE IMPAIRMENTS: decreased activity tolerance, decreased knowledge of condition, decreased ROM, decreased strength, hypomobility, increased fascial restrictions, impaired perceived functional ability, increased muscle spasms, impaired flexibility, impaired UE functional use, improper body mechanics, postural dysfunction, and pain.   ACTIVITY LIMITATIONS: carrying, lifting, sleeping, bathing, dressing, reach over head, hygiene/grooming, and caring for others  PARTICIPATION LIMITATIONS: meal prep, cleaning, laundry, shopping, community activity, and yard work  PERSONAL FACTORS: Past/current experiences, Time since onset of injury/illness/exacerbation, and 3+ comorbidities: HTN, fatigue, prediabetes, hypothyroidism, knee surgery, basal cell carcinoma, HLD are also affecting patient's functional outcome.   REHAB POTENTIAL: Good  CLINICAL DECISION MAKING: Evolving/moderate complexity  EVALUATION COMPLEXITY: Moderate   GOALS: Goals reviewed with patient? Yes  SHORT TERM GOALS: Target date: 08/23/2023   Patient will be independent with initial HEP to improve outcomes and carryover.  Baseline:  Goal status: MET - 08/23/23  LONG TERM GOALS: Target date: 09/20/2023  Patient will be independent with ongoing/advanced HEP for self-management at home.  Baseline:  Goal status: MET - 09/20/23   2.  Patient will report 50-75% improvement in B shoulder pain to improve QOL.  Baseline: L shoulder up to 2/10 since injection, R shoulder up to 3/10 Goal status: MET - 09/10/23 - 80% improvement  3.  Patient to demonstrate improved upright posture with posterior shoulder girdle engaged to promote improved glenohumeral joint mobility. Baseline: fwd head and rounded shoulder posture Goal status: MET -  09/20/23  4.  Patient to improve B shoulder AROM to WFL/WNL without pain provocation to allow for increased ease of ADLs.  Baseline: Refer to above UE ROM table Goal status: MET - 09/20/23  5.  Patient will demonstrate improved B shoulder strength to >/= 4 to 4+/5 for functional UE use. Baseline: Refer to above UE MMT table Goal status: MET - 09/16/23  6  Patient will report </= 11% on QuickDASH to demonstrate improved functional ability.  Baseline: 20.5 / 100 = 20.5 % Goal status: MET - 09/20/23 - 6.8 / 100 = 6.8 %  7.  Patient will report no sleep disturbance due to B shoulder pain.   Baseline: difficulty finding a comfortable sleeping position d/t B shoulder pain Goal status: MET - 09/10/23  8. Patient to report ability to perform ADLs, household, and leisure activities without limitation due to increased shoulder pain, LOM or weakness. Baseline: difficulty lifting heavy items like groceries, putting a bra on, reaching for the seat belt, and with jerking motions like starting the leaf blower Goal status: MET - 09/10/23   PLAN:  PT FREQUENCY: 2x/week  PT DURATION: 8 weeks  PLANNED INTERVENTIONS: 97164- PT Re-evaluation, 97110-Therapeutic exercises, 97530- Therapeutic activity, 97112- Neuromuscular re-education, 97535- Self Care, 95621- Manual therapy, 97014- Electrical stimulation (unattended), [id number]- Electrical stimulation (manual), 97016- Vasopneumatic device, 97035- Ultrasound, 30865- Ionotophoresis 4mg /ml Dexamethasone, Patient/Family education, Taping, Dry Needling, Joint mobilization, Spinal mobilization, Cryotherapy, and Moist heat  PLAN FOR NEXT SESSION: transition to HEP + 30-day hold   Francisco Irving, PT 09/20/2023, 10:59 AM   PHYSICAL THERAPY DISCHARGE SUMMARY  Visits from Start of Care: 9  Current functional level related to goals / functional outcomes: Refer to above clinical impression and goal assessment for status as of last visit on 09/20/2023. Patient was placed on  hold for 30 days and has not needed to return to PT, therefore will proceed with discharge from PT for this episode.     Remaining deficits: As above.   Education / Equipment: HEP   Patient agrees to discharge. Patient goals were met. Patient is being discharged due to meeting the stated rehab goals.  Francisco Irving, PT 01/13/2024, 10:54 AM  [facility] [address]  [address] [place], [place], [address] Phone: [phone]   Fax:  [phone]

## 2023-09-27 ENCOUNTER — Other Ambulatory Visit (HOSPITAL_BASED_OUTPATIENT_CLINIC_OR_DEPARTMENT_OTHER): Payer: Self-pay

## 2023-10-21 ENCOUNTER — Other Ambulatory Visit (HOSPITAL_BASED_OUTPATIENT_CLINIC_OR_DEPARTMENT_OTHER): Payer: Self-pay | Admitting: Family Medicine

## 2023-10-21 DIAGNOSIS — Z1231 Encounter for screening mammogram for malignant neoplasm of breast: Secondary | ICD-10-CM

## 2023-10-22 DIAGNOSIS — H43813 Vitreous degeneration, bilateral: Secondary | ICD-10-CM | POA: Diagnosis not present

## 2023-10-22 DIAGNOSIS — H2513 Age-related nuclear cataract, bilateral: Secondary | ICD-10-CM | POA: Diagnosis not present

## 2023-10-22 DIAGNOSIS — H43391 Other vitreous opacities, right eye: Secondary | ICD-10-CM | POA: Diagnosis not present

## 2023-10-26 ENCOUNTER — Encounter (HOSPITAL_BASED_OUTPATIENT_CLINIC_OR_DEPARTMENT_OTHER): Payer: Self-pay

## 2023-10-26 ENCOUNTER — Ambulatory Visit (HOSPITAL_BASED_OUTPATIENT_CLINIC_OR_DEPARTMENT_OTHER)
Admission: RE | Admit: 2023-10-26 | Discharge: 2023-10-26 | Disposition: A | Discharge status: Home / Self Care | Payer: Commercial Managed Care - PPO | Source: Ambulatory Visit | Attending: Family Medicine | Admitting: Family Medicine

## 2023-10-26 DIAGNOSIS — Z1231 Encounter for screening mammogram for malignant neoplasm of breast: Secondary | ICD-10-CM | POA: Insufficient documentation

## 2023-11-05 ENCOUNTER — Other Ambulatory Visit (HOSPITAL_BASED_OUTPATIENT_CLINIC_OR_DEPARTMENT_OTHER): Payer: Self-pay

## 2023-11-08 ENCOUNTER — Telehealth: Payer: Self-pay | Admitting: Cardiovascular Disease

## 2023-11-08 ENCOUNTER — Other Ambulatory Visit (HOSPITAL_BASED_OUTPATIENT_CLINIC_OR_DEPARTMENT_OTHER): Payer: Self-pay

## 2023-11-08 NOTE — Telephone Encounter (Signed)
 Patient c/o Palpitations:  STAT if patient reporting lightheadedness, shortness of breath, or chest pain  How long have you had palpitations/irregular HR/ Afib? Are you having the symptoms now? Started yesterday  Are you currently experiencing lightheadedness, SOB or CP? No  Do you have a history of afib (atrial fibrillation) or irregular heart rhythm? yes  Have you checked your BP or HR? (document readings if available): HR 45 yesterday today 155/97 HR 50  Are you experiencing any other symptoms? palps, dizzy, nausea last night

## 2023-11-08 NOTE — Telephone Encounter (Signed)
 Spoke to patient Dr.Croitoru's advice given.She will check her IPhone and next time shows slow heart rate she will obtain a EKG tracing and send to Dr.Croitoru.She will keep appointment with Marjie Skiff NP 3/5 at 10:30 am.

## 2023-11-08 NOTE — Telephone Encounter (Signed)
 Spoke to patient stated she ate too much food yesterday,during the night her IPhone woke her up heart rate 45.She felt dizzy B/P 145/90.She had chills, nausea,palpitations.Stated at present B/P 155/97 pulse 60.No dizziness at present.Appointment scheduled with Marjie Skiff NP 3/5 at 10:30 am.She wanted to ask Dr.Croitoru if she needs to go back to carvedilol 6.25 mg twice a day.Dose was decreased 1 month ago to 3.125 mg twice a day.I will send message to Dr.Croitoru for advice.

## 2023-11-08 NOTE — Telephone Encounter (Signed)
 The automatic report from her smart device is obtained by counting the pulse waveforms and can be erroneous if the rhythm is irregular (frequent PVCs sometimes lead to underestimation of the true heart rate).  That is why it is always a good idea to get an electrical tracing if available.  Does the smart watch have ECG capability?  Or does she have a Kardia device?  It would be great to actually see an ECG tracing when the device reports a slow heart rate.

## 2023-11-09 NOTE — Progress Notes (Unsigned)
 Cardiology Office Note:    Date:  11/10/2023   ID:  Jackie Washington, DOB 1961-04-15, MRN [id number]  PCP:  Marylen Ponto, MD  Cardiologist:  Thurmon Fair, MD     Referring MD: Marylen Ponto, MD   Chief Complaint: bradycardia   History of Present Illness:    Jackie Washington is a 63 y.o. female with a history of mild non-obstructive CAD on coronary CTA in 03/2023, small secundum atrial septal defect, mild dilatation of the ascending aorta measuring 41 mm, palpitations with associated PVCs, hypertension, hyperlipidemia, prediabetes, and hypothyroidism who is followed by Dr. Royann Shivers and presents today for evaluation of bradycardia.   Patient was referred to Dr. Royann Shivers in 03/2023 for further evaluation of palpitations. Prior Event Monitor showed 3% PVC burden as well as 2 brief episodes of atrial tachycardia (with longest episode last 7 beats). Palpitations correlated with PVC. Prior Echo in 06/2022 showed normal LV function with no significant valvular disease. She had tried multiple beta-blockers including Propranolol and Toprol-XL but noticed the most improvement with Coreg. EKG features suggested that PVCs may originate from the right outflow tract and  might be sensitive to calcium channel blockers. Therefore, Coreg was decreased and she was started on short-acting Diltiazem. She also described an episode of atypical chest pain at that initial visit with Dr. Royann Shivers. Coronary CTA was ordered for further evaluation which showed a coronary calcium score of 102 (88th percentile for age and sex) and mild non-obstructive CAD.  Patient was last seen by Dr. Royann Shivers in 07/2023 at which time the combination of low dose Coreg and Diltiazem was effective in helping with her palpitations. She stated they were "80% better." Losartan was stopped and she was switched to Irbesartan for better BP control.  Patient called our office on 11/08/2023 with concerns about low heart rates in the 40s.  Therefore, this visit was arranged for further evaluation. She states she "over ate" on the evening of 11/07/2023 and ate a lot of homemade donuts and then later that night started having worsening palpitations with dizziness, nausea, and chills. She initially attributed this to just overeating. However, then her Apple Watch notified her that her heart rate was in the 40s. EKG tracing from that time shows normal sinus rhythm with bigeminy PVCs and rates in the high 70s to 80s. She describes 3 other similar episodes to this since 08/2023 - one also occurred after overeating but the other one only occurred after a light meal. She states some of her symptoms feel similar to the vasovagal symptoms she used to have has a teenager. BP during episode on 11/07/2023 was in the 150/90s. She states he BP is usually around 130/90 but states she does not feel good when her BP gets low around 110/70. Overall, she feels like her palpitations/ PVC have been getting worse. She increased her Coreg to 6.25mg  twice daily several weeks ago due to worsening palpitations but states this caused more low BP readings. She decreased Coreg back to 3.125mg  twice daily after episode on 11/07/2023.  She denies any syncope, chest pain, shortness of breath, edema. She has not been able to be as active as she used to be. She does walk regularly but states she is not able to go as far as she used to because of palpitations with associated dizziness, nausea, and chills down the back of her neck which again she states feels similar to the vasovagal symptoms she had when she was younger.   EKGs/Labs/Other Studies  Reviewed:    The following studies were reviewed today:  Echocardiogram 06/08/2022: Conclusions: - LV: EF of 60-65% with normal wall motion and impaired relaxation pattern - RV: Normal size and function - Left atrium: Normal in size - Right atrium: Normal in size - Mitral valve: Structurally normal. No stenosis or regurgitation. - Aortic  valve: Structurally normal. No stenosis or regurgitation. - Pulmonic valve: Structurally normal. No stenosis. Trace regurgitation.  - Tricuspid valve: Structurally normal. No stenosis or regurgitation.  - Pericardium: No effusion. - Aorta: Visualized portion of aortic root, ascending aorta, descending aorta are normal in size.  _______________  Coronary CTA 04/07/2023: Impressions: 1. Coronary calcium score of 102. This was 30 percentile for age and sex matched control. 2. Normal coronary origin with right dominance. 3. CAD-RADS 2. Mild non-obstructive CAD (25-49%). Consider non-atherosclerotic causes of chest pain. Consider preventive therapy and risk factor modification. 4. Very Small secundum atrial septal defect (3.28 mm). 5. Dilatation of the proximal ascending aorta 41 mm.  EKG:  EKG  ordered today.   EKG Interpretation Date/Time:  Wednesday November 10 2023 10:52:48 EST Ventricular Rate:  81 PR Interval:  166 QRS Duration:  82 QT Interval:  376 QTC Calculation: 436 R Axis:   44  Text Interpretation: Sinus rhythm with occasional Premature ventricular complexes Possible Left atrial enlargement  No acute ischemic changes Confirmed by Marjie Skiff [phone]) on 11/10/2023 1:17:03 PM     Recent Labs: 03/29/2023: BUN 18; Creatinine, Ser 0.94; Potassium 4.6; Sodium 142  Recent Lipid Panel    Component Value Date/Time   CHOL 139 06/02/2023 0808   TRIG 122 06/02/2023 0808   HDL 46 06/02/2023 0808   CHOLHDL 3.0 06/02/2023 0808   LDLCALC 71 06/02/2023 0808    Physical Exam:    Vital Signs: BP 120/80   Pulse 81   Ht 5\' 11"  (1.803 m)   Wt 191 lb (86.6 kg)   SpO2 94%   BMI 26.64 kg/m     Wt Readings from Last 3 Encounters:  11/10/23 191 lb (86.6 kg)  08/04/23 192 lb (87.1 kg)  04/09/23 195 lb 3.2 oz (88.5 kg)     General: 63 y.o. Caucasian female in no acute distress. HEENT: Normocephalic and atraumatic. Sclera clear.  Neck: Supple. No carotid bruits. No JVD. Heart:  RRR. Distinct S1 and S2. No murmurs, gallops, or rubs.  Lungs: No increased work of breathing. Clear to ausculation bilaterally. No wheezes, rhonchi, or rales.  Extremities: No lower extremity edema.  Skin: Warm and dry. Neuro: No focal deficits. Psych: Normal affect. Responds appropriately.   Assessment:    1. Palpitations   2. PVC (premature ventricular contraction)   3. Mild non-obstructive CAD   4. Mild dilation of ascending aorta (HCC)   5. ASD (atrial septal defect)   6. Essential hypertension   7. 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 She had an episode earlier this week where was having significant palpitations with associated dizziness, nausea, and chills. Her Apple Watch notified her that her heart rate was in 40s at that time. However, EKG tracing from that time showed normal sinus rhythm with bigeminy PVCs and rates of 70s to 80s.  - She reports worsening palpitations/ PVCs lately and is very symptomatic with this.  - Continue Coreg 3.125mg  twice daily. She tried increasing this to 6.25mg  twice daily but felt like this  dropped her BP too much and she did not feel well wit hthis.  - Will increase Diltiazem 180mg  daily.  - Will also provide prescription of short acting Diltiazem 30mg  that she can take as needed for persistent palpitations lasting more than 10 minutes (she can take this up to twice a day). - Will repeat a 3 week Zio monitor to reassess PVC burden.  Mild Non-Obstructive CAD Coronary CTA in 03/2023 showed a coronary calcium score of 102 (88th percentile for age and sex) and mild non-obstructive CAD (primarily in the distal RCA). - No chest pain. - Continue aspirin and statin/ Zetia.    Mild Dilatation of Ascending Aorta Coronary CTA in 03/2023 showed dilatation of the proximal ascending aorta measuring 41 mm. She has no  family history of aortic aneurysm or unexplained sudden death.  - Continue to focus on good BP control. - Plan is for repeat chest CTA in 03/2024.  ASD Coronary CTA in 03/2023 showed a very small secundum atrial septal defect (3.28 mm).  - No surgical intervention needed.   Hypertension BP well controlled in the office today. - Current medications: Irbesartan 75mg  daily, Coreg 3.125mg  twice daily, and Diltiazem 120mg  daily. Will increase Diltiazem to 180mg  daily to see if this helps with her worsening palpitations.   Hyperlipidemia Lipid panel in 05/2023: Total Cholesterol 139, Triglycerides 122, HDL 46, LDL 71. LDL goal <70 given CAD. - Continue Lipitor 80mg  daily and Zetia 10mg  daily.   Disposition: Follow up in 3-4 months.   Signed, Corrin Parker, PA-C  11/10/2023 1:29 PM    [facility]

## 2023-11-10 ENCOUNTER — Ambulatory Visit (INDEPENDENT_AMBULATORY_CARE_PROVIDER_SITE_OTHER)

## 2023-11-10 ENCOUNTER — Ambulatory Visit: Attending: Student | Admitting: Student

## 2023-11-10 ENCOUNTER — Other Ambulatory Visit: Payer: Self-pay | Admitting: Student

## 2023-11-10 ENCOUNTER — Encounter: Payer: Self-pay | Admitting: Student

## 2023-11-10 ENCOUNTER — Other Ambulatory Visit (HOSPITAL_BASED_OUTPATIENT_CLINIC_OR_DEPARTMENT_OTHER): Payer: Self-pay

## 2023-11-10 VITALS — BP 120/80 | HR 81 | Ht 71.0 in | Wt 191.0 lb

## 2023-11-10 DIAGNOSIS — I10 Essential (primary) hypertension: Secondary | ICD-10-CM

## 2023-11-10 DIAGNOSIS — E785 Hyperlipidemia, unspecified: Secondary | ICD-10-CM

## 2023-11-10 DIAGNOSIS — R002 Palpitations: Secondary | ICD-10-CM | POA: Diagnosis not present

## 2023-11-10 DIAGNOSIS — R001 Bradycardia, unspecified: Secondary | ICD-10-CM

## 2023-11-10 DIAGNOSIS — I77810 Thoracic aortic ectasia: Secondary | ICD-10-CM | POA: Diagnosis not present

## 2023-11-10 DIAGNOSIS — I493 Ventricular premature depolarization: Secondary | ICD-10-CM | POA: Diagnosis not present

## 2023-11-10 DIAGNOSIS — I2510 Atherosclerotic heart disease of native coronary artery without angina pectoris: Secondary | ICD-10-CM

## 2023-11-10 DIAGNOSIS — Q211 Atrial septal defect, unspecified: Secondary | ICD-10-CM

## 2023-11-10 MED ORDER — CARVEDILOL 3.125 MG PO TABS
3.1250 mg | ORAL_TABLET | Freq: Two times a day (BID) | ORAL | 3 refills | Status: DC
  Filled 2023-11-10 (×2): qty 180, 90d supply, fill #0
  Filled 2024-02-23: qty 180, 90d supply, fill #1
  Filled 2024-06-20: qty 180, 90d supply, fill #2

## 2023-11-10 MED ORDER — DILTIAZEM HCL 30 MG PO TABS
30.0000 mg | ORAL_TABLET | Freq: Two times a day (BID) | ORAL | 3 refills | Status: AC | PRN
  Filled 2023-11-10: qty 90, 45d supply, fill #0

## 2023-11-10 MED ORDER — DILTIAZEM HCL ER COATED BEADS 180 MG PO CP24
180.0000 mg | ORAL_CAPSULE | Freq: Every day | ORAL | 3 refills | Status: DC
  Filled 2023-11-10: qty 90, 90d supply, fill #0

## 2023-11-10 NOTE — Patient Instructions (Addendum)
 Medication Instructions:  Increase Diltiazem CD 180 mg daily Take Diltiazem 30 mg  take twice a day if needed for persistent palpitations Continue all other medications  *If you need a refill on your cardiac medications before your next appointment, please call your pharmacy*   Lab Work: None ordered   Testing/Procedures: 3 day Zio heart monitor will be mailed with instructions   Follow-Up: At [facility], you and your health needs are our priority.  As part of our continuing mission to provide you with exceptional heart care, we have created designated Provider Care Teams.  These Care Teams include your primary Cardiologist (physician) and Advanced Practice Providers (APPs -  Physician Assistants and Nurse Practitioners) who all work together to provide you with the care you need, when you need it.  We recommend signing up for the patient portal called "MyChart".  Sign up information is provided on this After Visit Summary.  MyChart is used to connect with patients for Virtual Visits (Telemedicine).  Patients are able to view lab/test results, encounter notes, upcoming appointments, etc.  Non-urgent messages can be sent to your provider as well.   To learn more about what you can do with MyChart, go to [email or web address].    Your next appointment:  4 months    Provider:  Marjie Skiff PA

## 2023-11-10 NOTE — Progress Notes (Unsigned)
 Enrolled patient for a 3 day Zio XT monitor to be mailed to patients home   Croitoru to read

## 2023-11-12 DIAGNOSIS — R002 Palpitations: Secondary | ICD-10-CM | POA: Diagnosis not present

## 2023-11-14 NOTE — Progress Notes (Signed)
 Thanks, Callie. If the burden of PVCs remains high and she remains symptomatic, we can consider EP referral for ablation.

## 2023-11-26 ENCOUNTER — Other Ambulatory Visit (HOSPITAL_BASED_OUTPATIENT_CLINIC_OR_DEPARTMENT_OTHER): Payer: Self-pay

## 2023-11-29 ENCOUNTER — Encounter: Payer: Self-pay | Admitting: Cardiovascular Disease

## 2023-11-29 DIAGNOSIS — R001 Bradycardia, unspecified: Secondary | ICD-10-CM | POA: Diagnosis not present

## 2023-11-29 DIAGNOSIS — R002 Palpitations: Secondary | ICD-10-CM | POA: Diagnosis not present

## 2023-11-30 ENCOUNTER — Other Ambulatory Visit (HOSPITAL_BASED_OUTPATIENT_CLINIC_OR_DEPARTMENT_OTHER): Payer: Self-pay

## 2023-12-01 ENCOUNTER — Other Ambulatory Visit (HOSPITAL_BASED_OUTPATIENT_CLINIC_OR_DEPARTMENT_OTHER): Payer: Self-pay

## 2023-12-01 MED ORDER — LEVOTHYROXINE SODIUM 75 MCG PO TABS
75.0000 ug | ORAL_TABLET | Freq: Every day | ORAL | 0 refills | Status: DC
  Filled 2023-12-01: qty 30, 30d supply, fill #0

## 2023-12-27 ENCOUNTER — Other Ambulatory Visit (HOSPITAL_BASED_OUTPATIENT_CLINIC_OR_DEPARTMENT_OTHER): Payer: Self-pay

## 2023-12-27 MED ORDER — ATORVASTATIN CALCIUM 80 MG PO TABS
80.0000 mg | ORAL_TABLET | Freq: Every day | ORAL | 0 refills | Status: DC
  Filled 2023-12-27: qty 30, 30d supply, fill #0

## 2024-01-03 ENCOUNTER — Other Ambulatory Visit (HOSPITAL_BASED_OUTPATIENT_CLINIC_OR_DEPARTMENT_OTHER): Payer: Self-pay

## 2024-01-03 ENCOUNTER — Other Ambulatory Visit (HOSPITAL_COMMUNITY): Payer: Self-pay

## 2024-01-03 ENCOUNTER — Encounter: Payer: Self-pay | Admitting: Cardiovascular Disease

## 2024-01-03 ENCOUNTER — Other Ambulatory Visit: Payer: Self-pay

## 2024-01-03 DIAGNOSIS — M8580 Other specified disorders of bone density and structure, unspecified site: Secondary | ICD-10-CM | POA: Diagnosis not present

## 2024-01-03 DIAGNOSIS — Z6827 Body mass index (BMI) 27.0-27.9, adult: Secondary | ICD-10-CM | POA: Diagnosis not present

## 2024-01-03 DIAGNOSIS — Z1331 Encounter for screening for depression: Secondary | ICD-10-CM | POA: Diagnosis not present

## 2024-01-03 DIAGNOSIS — Z13220 Encounter for screening for lipoid disorders: Secondary | ICD-10-CM | POA: Diagnosis not present

## 2024-01-03 DIAGNOSIS — E039 Hypothyroidism, unspecified: Secondary | ICD-10-CM | POA: Diagnosis not present

## 2024-01-03 DIAGNOSIS — Z1339 Encounter for screening examination for other mental health and behavioral disorders: Secondary | ICD-10-CM | POA: Diagnosis not present

## 2024-01-03 DIAGNOSIS — Z0000 Encounter for general adult medical examination without abnormal findings: Secondary | ICD-10-CM | POA: Diagnosis not present

## 2024-01-03 MED ORDER — DILTIAZEM HCL ER COATED BEADS 240 MG PO CP24
240.0000 mg | ORAL_CAPSULE | Freq: Every day | ORAL | 3 refills | Status: DC
  Filled 2024-01-03: qty 90, 90d supply, fill #0
  Filled 2024-03-27: qty 90, 90d supply, fill #1

## 2024-01-03 MED ORDER — ATORVASTATIN CALCIUM 80 MG PO TABS
80.0000 mg | ORAL_TABLET | Freq: Every day | ORAL | 3 refills | Status: AC
  Filled 2024-01-03 – 2024-01-21 (×2): qty 90, 90d supply, fill #0
  Filled 2024-04-25: qty 90, 90d supply, fill #1
  Filled 2024-07-21: qty 90, 90d supply, fill #2

## 2024-01-03 MED ORDER — LEVOTHYROXINE SODIUM 75 MCG PO TABS
75.0000 ug | ORAL_TABLET | Freq: Every day | ORAL | 3 refills | Status: AC
  Filled 2024-01-03 (×2): qty 30, 30d supply, fill #0
  Filled 2024-02-07: qty 30, 30d supply, fill #1
  Filled 2024-02-23: qty 30, 30d supply, fill #2
  Filled 2024-03-27: qty 90, 90d supply, fill #3
  Filled 2024-07-06: qty 90, 90d supply, fill #4
  Filled 2024-09-29: qty 90, 90d supply, fill #5

## 2024-01-03 NOTE — Telephone Encounter (Signed)
 Yes please refill diltiazem  240 mg once daily please

## 2024-01-04 ENCOUNTER — Other Ambulatory Visit (HOSPITAL_BASED_OUTPATIENT_CLINIC_OR_DEPARTMENT_OTHER): Payer: Self-pay

## 2024-01-04 DIAGNOSIS — M8580 Other specified disorders of bone density and structure, unspecified site: Secondary | ICD-10-CM | POA: Diagnosis not present

## 2024-01-04 DIAGNOSIS — E039 Hypothyroidism, unspecified: Secondary | ICD-10-CM | POA: Diagnosis not present

## 2024-01-21 ENCOUNTER — Other Ambulatory Visit (HOSPITAL_BASED_OUTPATIENT_CLINIC_OR_DEPARTMENT_OTHER): Payer: Self-pay

## 2024-02-07 ENCOUNTER — Other Ambulatory Visit (HOSPITAL_BASED_OUTPATIENT_CLINIC_OR_DEPARTMENT_OTHER): Payer: Self-pay

## 2024-02-16 ENCOUNTER — Encounter: Payer: Self-pay | Admitting: Cardiovascular Disease

## 2024-02-22 ENCOUNTER — Other Ambulatory Visit

## 2024-02-23 ENCOUNTER — Other Ambulatory Visit (HOSPITAL_BASED_OUTPATIENT_CLINIC_OR_DEPARTMENT_OTHER): Payer: Self-pay

## 2024-03-23 ENCOUNTER — Ambulatory Visit
Admission: RE | Admit: 2024-03-23 | Discharge: 2024-03-23 | Disposition: A | Discharge status: Home / Self Care | Source: Ambulatory Visit | Attending: Cardiovascular Disease | Admitting: Cardiovascular Disease

## 2024-03-23 DIAGNOSIS — R918 Other nonspecific abnormal finding of lung field: Secondary | ICD-10-CM | POA: Diagnosis not present

## 2024-03-23 DIAGNOSIS — I712 Thoracic aortic aneurysm, without rupture, unspecified: Secondary | ICD-10-CM

## 2024-03-23 DIAGNOSIS — I77810 Thoracic aortic ectasia: Secondary | ICD-10-CM | POA: Diagnosis not present

## 2024-03-23 MED ORDER — IOPAMIDOL (ISOVUE-370) INJECTION 76%
75.0000 mL | Freq: Once | INTRAVENOUS | Status: AC | PRN
  Administered 2024-03-23: 75 mL via INTRAVENOUS

## 2024-03-24 ENCOUNTER — Other Ambulatory Visit (HOSPITAL_BASED_OUTPATIENT_CLINIC_OR_DEPARTMENT_OTHER): Payer: Self-pay

## 2024-03-27 ENCOUNTER — Ambulatory Visit: Payer: Self-pay | Admitting: Cardiovascular Disease

## 2024-03-27 ENCOUNTER — Other Ambulatory Visit (HOSPITAL_BASED_OUTPATIENT_CLINIC_OR_DEPARTMENT_OTHER): Payer: Self-pay

## 2024-03-29 ENCOUNTER — Other Ambulatory Visit: Payer: Self-pay | Admitting: Emergency Medicine

## 2024-03-29 DIAGNOSIS — I77810 Thoracic aortic ectasia: Secondary | ICD-10-CM

## 2024-03-29 NOTE — Progress Notes (Unsigned)
 Dzien dobry The ascending aorta is mildly dilated at 4.3 cm. This compares favorably to 4.1 cm on last year's CT and is within the range of inter-observer variability (our Radiology colleagues tend to be 5-10% more generous in their measurements compared to Cardiology). Since there is a nominal increase in diameter, I would probably look again, no sooner than 12 months from now. If the size remains stable we can discuss spacing out the CTs, but if it keeps growing, we will keep checking yearly. This is still very mild and much smaller than when we recommend surgical consultation (usually see the surgeon if it reaches 5 cm, surgery when it reaches 5.5 cm). Always open for questions or requests, Mihai  Written by Jerel Balding, MD on 03/27/2024  5:59 PM EDT Seen by patient Jackie Washington on 03/27/2024  9:44 PM

## 2024-04-25 ENCOUNTER — Other Ambulatory Visit (HOSPITAL_BASED_OUTPATIENT_CLINIC_OR_DEPARTMENT_OTHER): Payer: Self-pay

## 2024-06-06 ENCOUNTER — Other Ambulatory Visit: Payer: Self-pay | Admitting: Cardiovascular Disease

## 2024-06-07 ENCOUNTER — Other Ambulatory Visit: Payer: Self-pay | Admitting: Cardiovascular Disease

## 2024-06-07 ENCOUNTER — Other Ambulatory Visit (HOSPITAL_BASED_OUTPATIENT_CLINIC_OR_DEPARTMENT_OTHER): Payer: Self-pay

## 2024-06-07 ENCOUNTER — Encounter (HOSPITAL_BASED_OUTPATIENT_CLINIC_OR_DEPARTMENT_OTHER): Payer: Self-pay

## 2024-06-07 MED ORDER — EZETIMIBE 10 MG PO TABS
10.0000 mg | ORAL_TABLET | Freq: Every day | ORAL | 1 refills | Status: AC
  Filled 2024-06-07: qty 90, 90d supply, fill #0
  Filled 2024-09-04: qty 90, 90d supply, fill #1

## 2024-06-20 ENCOUNTER — Other Ambulatory Visit (HOSPITAL_BASED_OUTPATIENT_CLINIC_OR_DEPARTMENT_OTHER): Payer: Self-pay

## 2024-06-23 ENCOUNTER — Other Ambulatory Visit (HOSPITAL_BASED_OUTPATIENT_CLINIC_OR_DEPARTMENT_OTHER): Payer: Self-pay

## 2024-06-23 ENCOUNTER — Ambulatory Visit: Attending: Cardiovascular Disease | Admitting: Cardiovascular Disease

## 2024-06-23 ENCOUNTER — Encounter: Payer: Self-pay | Admitting: Cardiovascular Disease

## 2024-06-23 VITALS — BP 110/80 | HR 78 | Ht 71.0 in | Wt 191.4 lb

## 2024-06-23 DIAGNOSIS — E039 Hypothyroidism, unspecified: Secondary | ICD-10-CM | POA: Diagnosis not present

## 2024-06-23 DIAGNOSIS — E7800 Pure hypercholesterolemia, unspecified: Secondary | ICD-10-CM | POA: Insufficient documentation

## 2024-06-23 DIAGNOSIS — I77810 Thoracic aortic ectasia: Secondary | ICD-10-CM | POA: Diagnosis not present

## 2024-06-23 DIAGNOSIS — I10 Essential (primary) hypertension: Secondary | ICD-10-CM | POA: Diagnosis not present

## 2024-06-23 DIAGNOSIS — I2510 Atherosclerotic heart disease of native coronary artery without angina pectoris: Secondary | ICD-10-CM | POA: Diagnosis not present

## 2024-06-23 DIAGNOSIS — I493 Ventricular premature depolarization: Secondary | ICD-10-CM | POA: Diagnosis not present

## 2024-06-23 MED ORDER — DILTIAZEM HCL ER COATED BEADS 300 MG PO CP24
300.0000 mg | ORAL_CAPSULE | Freq: Every day | ORAL | 3 refills | Status: AC
  Filled 2024-06-23: qty 90, 90d supply, fill #0
  Filled 2024-09-18: qty 90, 90d supply, fill #1

## 2024-06-23 MED ORDER — METOPROLOL SUCCINATE ER 25 MG PO TB24
12.5000 mg | ORAL_TABLET | Freq: Every day | ORAL | 3 refills | Status: DC
  Filled 2024-06-23: qty 45, 90d supply, fill #0

## 2024-06-23 NOTE — Progress Notes (Signed)
 Cardiology Office Note:  .   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    She continues to have problems with symptomatic PVCs although overall better compared to last year.  The combination of low-dose carvedilol  and diltiazem  was much more effective in suppressing her symptoms than previous treatment with beta-blockers alone (when she was taking higher doses of carvedilol  she had severe fatigue).  Symptoms are often triggered by heat such as being at the beach in the or by exercising, especially if the weather is warm.  She feels very frequent PVCs as a sensation of palpitations in her throat and feels exhausted.  The spells occur every 1 or 2 months and are not a daily problem.  She has not had syncope.  She denies exertional dyspnea or chest pain.  She has not lower extremity edema, orthopnea, PND, claudication or focal neurological complaints.  The arrhythmia monitor performed 11/29/2023 showed a 9% burden of PVCs and her symptoms correlated with the periods of increased PVC frequency.  The PVCs are largely monomorphic and often occur in bigeminy or trigeminy.  She did not have ventricular tachycardia.  She did not have any episodes of severe bradycardia or pauses.   Treatment with carvedilol  at a dose of 6.25 mg twice daily has led to improvement in the palpitations, but not completely relief of the symptoms.  She was intolerant to low-dose of 12.5 mg daily due to  extreme fatigue.  Previously on treatment with propranolol, subsequently metoprolol  succinate, which were less effective and also did not provide sufficient blood pressure control.  She has never smoked.  Most recent lipid profile from 11/30/2022 show very mild hypertriglyceridemia and a borderline HDL cholesterol of any 41 (by my calculation LDL around 106).  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  She does not have diabetes mellitus.  The most recent labs were performed on atorvastatin  80 mg daily.  There is a family history of coronary disease, but not had early age (her mother had bypass surgery at age 74, died of breast cancer at age 58.  Her father had chronic lung disease related to smoking.  She wore an event monitor recently that identified her palpitations as being related to PVCs.  These occurred at a rate of about 3%.  They correlated with her symptoms.  She did not have any significant bradycardia and there was normal circadian rhythm variation.  There was no atrial fibrillation.  The monitor did document 2 very brief episodes of atrial tachycardia lasting only 4 and 7 beats respectively that were not associated with symptoms.  She had an echocardiogram last October that showed essentially normal findings.  There was no evidence of chamber hypertrophy or dilation, LVEF was 60 to 65%, there were no significant valvular abnormalities.  The right ventricle was  also described as normal in size and function.  Images are not available for review.  She is originally from [place], [place].  Her husband Jackie Washington is a cardiologist in our practice.  ROS: As described above  Studies Reviewed: Jackie Washington   EKG Interpretation Date/Time:  Friday June 23 2024 10:55:54 EDT Ventricular Rate:  78 PR  Interval:  180 QRS Duration:  80 QT Interval:  394 QTC Calculation: 449 R Axis:   22  Text Interpretation: Sinus rhythm with occasional Premature ventricular complexes Possible Left atrial enlargement When compared with ECG of 10-Nov-2023 10:52, No significant change was found Confirmed by Tor Tsuda ([id number]) on 06/23/2024 11:10:25 AM Echo report 06/08/2022 from [facility]  11/30/2022 cholesterol 176, HDL 41, triglycerides 230, hemoglobin 13.4, creatinine 0.8, ALT 36, TSH 2.66   Lipid Panel     Component Value Date/Time   CHOL 139 06/02/2023 0808   TRIG 122 06/02/2023 0808   HDL 46 06/02/2023 0808   CHOLHDL 3.0 06/02/2023 0808   LDLCALC 71 06/02/2023 0808   LABVLDL 22 06/02/2023 0808     04/23/2021 cholesterol 206, direct LDL 132, triglycerides 217, HDL 48 06/02/2023 cholesterol 139, HDL 46, triglycerides 122, LDL 71 01/03/2024 cholesterol 134, HDL 40, triglycerides 139, LDL 71  Risk Assessment/Calculations:             Physical Exam:   VS:  BP 110/80 (BP Location: Left Arm, Patient Position: Sitting, Cuff Size: Normal)   Pulse 78   Ht 5' 11 (1.803 m)   Wt 191 lb 6.4 oz (86.8 kg)   SpO2 98%   BMI 26.69 kg/m    Wt Readings from Last 3 Encounters:  06/23/24 191 lb 6.4 oz (86.8 kg)  11/10/23 191 lb (86.6 kg)  08/04/23 192 lb (87.1 kg)      General: Alert, oriented x3, no distress, minimally overweight, appears fit Head: no evidence of trauma, PERRL, EOMI, no exophtalmos or lid lag, no myxedema, no xanthelasma; normal ears, nose and oropharynx Neck: normal jugular venous pulsations and no hepatojugular reflux; brisk carotid pulses without delay and no carotid bruits Chest: clear to auscultation, no signs of consolidation by percussion or palpation, normal fremitus, symmetrical and full respiratory excursions Cardiovascular: normal position and quality of the apical impulse, regular rhythm with frequent superimposed ectopic beats, normal first and second heart  sounds, no murmurs, rubs or gallops Abdomen: no tenderness or distention, no masses by palpation, no abnormal pulsatility or arterial bruits, normal bowel sounds, no hepatosplenomegaly Extremities: no clubbing, cyanosis or edema; 2+ radial, ulnar and brachial pulses bilaterally; 2+ right femoral, posterior tibial and dorsalis pedis pulses; 2+ left femoral, posterior tibial and dorsalis pedis pulses; no subclavian or femoral bruits Neurological: grossly nonfocal Psych: Normal mood and affect    ASSESSMENT AND PLAN: .      1. Coronary artery calcification   2. PVCs (premature ventricular contractions)   3. Essential hypertension   4. Mild dilation of ascending aorta   5. Hypercholesterolemia   6. Acquired hypothyroidism      Elevated coronary calcium  score: She does not have angina pectoris.  Coronary calcification was largely seen in the right coronary artery, but the most severe stenosis was less than 49% risk factors. 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  Has had incomplete relief of symptoms on a combination of low-dose carvedilol  and diltiazem .  Blood pressure only allows a little more increase in medications.  Will change from carvedilol   to low-dose metoprolol  and increase the diltiazem  (which has helped more than anything else) to 300 mg daily.  She has not had significant bradycardia and denies dizziness.  Has not had complex ventricular arrhythmia and the PVCs are almost all identical.  There is no family history of arrhythmia or sudden cardiac death and ethnic background is not from an area with high prevalence of arrhythmogenic cardiomyopathy.  The level of suspicion for arrhythmogenic cardiomyopathy is low.  Further options for therapy include flecainide, sotalol or even ablation.  If symptoms are not controlled with the above adjustment in medications, will refer to Dr. Soyla Norton for EP consultation  in [place]. HTN: Blood pressure is relatively low on combination diltiazem  and carvedilol .  Switching to metoprolol . Dilation of the ascending aorta: Measured 4.3 cm by CT angiography in July 2025, slight increase from 4.1 cm in August 2024.  Recheck in another year. HLP: Target LDL cholesterol less than 70, close to that on combination ezetimibe  and atorvastatin . PreDM: Do not have a more recent hemoglobin A1c.  Labs from 2019-2021 show a hemoglobin A1c of 6.0-6.1%.  Avoid excessive carbohydrates and increase physical exercise.  Avoid gaining weight.   Hypothyroidism: TSH normal and clinically euthyroid on the current dose of levothyroxine .       Patient Instructions  Medication Instructions:  Stop Carvedilol  Start Metoprolol  Succinate 12.5 mg daily Increase Diltiazem  to 300 mg daily *If you need a refill on your cardiac medications before your next appointment, please call your pharmacy*  Lab Work: None ordered If you have labs (blood work) drawn today and your tests are completely normal, you will receive your results only by: MyChart Message (if you have MyChart) OR A paper copy in the mail If you have any lab test that is abnormal or we need to change your treatment, we will call you to review the results.  Testing/Procedures: None ordered  Follow-Up: At [facility], you and your health needs are our priority.  As part of our continuing mission to provide you with exceptional heart care, our providers are all part of one team.  This team includes your primary Cardiologist (physician) and Advanced Practice Providers or APPs (Physician Assistants and Nurse Practitioners) who all work together to provide you with the care you need, when you need it.  Your next appointment:   1 year(s)  Provider:   Dr Francyne  We recommend signing up for the patient portal called MyChart.  Sign up information is provided on this After Visit Summary.  MyChart is used to connect with  patients for Virtual Visits (Telemedicine).  Patients are able to view lab/test results, encounter notes, upcoming appointments, etc.  Non-urgent messages can be sent to your provider as well.   To learn more about what you can do with MyChart, go to [email or web address].      Signed, Jerel Francyne, MD

## 2024-06-23 NOTE — Patient Instructions (Signed)
 Medication Instructions:  Stop Carvedilol  Start Metoprolol  Succinate 12.5 mg daily Increase Diltiazem  to 300 mg daily *If you need a refill on your cardiac medications before your next appointment, please call your pharmacy*  Lab Work: None ordered If you have labs (blood work) drawn today and your tests are completely normal, you will receive your results only by: MyChart Message (if you have MyChart) OR A paper copy in the mail If you have any lab test that is abnormal or we need to change your treatment, we will call you to review the results.  Testing/Procedures: None ordered  Follow-Up: At [facility], you and your health needs are our priority.  As part of our continuing mission to provide you with exceptional heart care, our providers are all part of one team.  This team includes your primary Cardiologist (physician) and Advanced Practice Providers or APPs (Physician Assistants and Nurse Practitioners) who all work together to provide you with the care you need, when you need it.  Your next appointment:   1 year(s)  Provider:   Dr Francyne  We recommend signing up for the patient portal called MyChart.  Sign up information is provided on this After Visit Summary.  MyChart is used to connect with patients for Virtual Visits (Telemedicine).  Patients are able to view lab/test results, encounter notes, upcoming appointments, etc.  Non-urgent messages can be sent to your provider as well.   To learn more about what you can do with MyChart, go to [email or web address].

## 2024-06-28 ENCOUNTER — Other Ambulatory Visit (HOSPITAL_COMMUNITY): Payer: Self-pay

## 2024-07-06 ENCOUNTER — Other Ambulatory Visit (HOSPITAL_BASED_OUTPATIENT_CLINIC_OR_DEPARTMENT_OTHER): Payer: Self-pay

## 2024-07-07 ENCOUNTER — Telehealth: Payer: Self-pay | Admitting: Emergency Medicine

## 2024-07-07 DIAGNOSIS — R002 Palpitations: Secondary | ICD-10-CM

## 2024-07-07 DIAGNOSIS — R0600 Dyspnea, unspecified: Secondary | ICD-10-CM

## 2024-07-07 NOTE — Telephone Encounter (Signed)
 Croitoru, Mihai, MD  Davee Comer CROME, RN She reached out with complaints of increased fatigue and dyspnea and very frequent PVCs despite the change in meds. Can we please order an echo and 14 day monitor for palpitations and dyspnea? Thanks  Echo and monitor ordered. Called and notified the patient's husband (he reports that she just went for a walk). He asked if the Echo can be performed in [place], informed him that it can be performed there. Also informed that the monitor will come to their house through the mail.   Echo order edited- to [place] location.   He verbalized understanding of all information. MyChart message also sent.

## 2024-07-10 ENCOUNTER — Ambulatory Visit: Attending: Cardiovascular Disease

## 2024-07-10 DIAGNOSIS — R002 Palpitations: Secondary | ICD-10-CM

## 2024-07-10 DIAGNOSIS — R0600 Dyspnea, unspecified: Secondary | ICD-10-CM

## 2024-07-10 NOTE — Progress Notes (Unsigned)
 Enrolled for [employer] to mail a ZIO XT long term holter monitor to the patients address on file.

## 2024-07-11 ENCOUNTER — Ambulatory Visit (HOSPITAL_BASED_OUTPATIENT_CLINIC_OR_DEPARTMENT_OTHER)

## 2024-07-11 ENCOUNTER — Ambulatory Visit: Payer: Self-pay | Admitting: Cardiovascular Disease

## 2024-07-11 ENCOUNTER — Ambulatory Visit: Attending: Cardiovascular Disease

## 2024-07-11 DIAGNOSIS — I493 Ventricular premature depolarization: Secondary | ICD-10-CM

## 2024-07-11 DIAGNOSIS — R0600 Dyspnea, unspecified: Secondary | ICD-10-CM

## 2024-07-11 DIAGNOSIS — R002 Palpitations: Secondary | ICD-10-CM | POA: Diagnosis not present

## 2024-07-11 LAB — ECHOCARDIOGRAM COMPLETE
AR max vel: 2.48 cm2
AV Area VTI: 2.42 cm2
AV Area mean vel: 2.4 cm2
AV Mean grad: 4.7 mmHg
AV Peak grad: 8.8 mmHg
Ao pk vel: 1.48 m/s
Area-P 1/2: 2.37 cm2
MV M vel: 2.87 m/s
MV Peak grad: 32.9 mmHg
MV VTI: 1.99 cm2
S' Lateral: 2.7 cm

## 2024-07-19 ENCOUNTER — Other Ambulatory Visit (HOSPITAL_BASED_OUTPATIENT_CLINIC_OR_DEPARTMENT_OTHER): Payer: Self-pay

## 2024-07-21 ENCOUNTER — Other Ambulatory Visit (HOSPITAL_BASED_OUTPATIENT_CLINIC_OR_DEPARTMENT_OTHER): Payer: Self-pay

## 2024-07-31 ENCOUNTER — Other Ambulatory Visit (HOSPITAL_BASED_OUTPATIENT_CLINIC_OR_DEPARTMENT_OTHER): Payer: Self-pay

## 2024-08-01 DIAGNOSIS — R002 Palpitations: Secondary | ICD-10-CM

## 2024-08-01 DIAGNOSIS — R0600 Dyspnea, unspecified: Secondary | ICD-10-CM | POA: Diagnosis not present

## 2024-08-02 ENCOUNTER — Telehealth: Payer: Self-pay | Admitting: Emergency Medicine

## 2024-08-02 NOTE — Telephone Encounter (Signed)
 Faxed documents- total of 27 pages to [facility]- Franky Belvie Mace, MD

## 2024-09-04 ENCOUNTER — Other Ambulatory Visit (HOSPITAL_BASED_OUTPATIENT_CLINIC_OR_DEPARTMENT_OTHER): Payer: Self-pay

## 2024-09-04 ENCOUNTER — Other Ambulatory Visit (HOSPITAL_COMMUNITY): Payer: Self-pay

## 2024-09-11 ENCOUNTER — Other Ambulatory Visit (HOSPITAL_COMMUNITY): Payer: Self-pay

## 2024-09-19 ENCOUNTER — Other Ambulatory Visit: Payer: Self-pay | Admitting: Student

## 2024-09-19 ENCOUNTER — Encounter: Payer: Self-pay | Admitting: Cardiovascular Disease

## 2024-09-19 ENCOUNTER — Other Ambulatory Visit (HOSPITAL_BASED_OUTPATIENT_CLINIC_OR_DEPARTMENT_OTHER): Payer: Self-pay

## 2024-09-20 ENCOUNTER — Other Ambulatory Visit (HOSPITAL_BASED_OUTPATIENT_CLINIC_OR_DEPARTMENT_OTHER): Payer: Self-pay

## 2024-09-20 NOTE — Telephone Encounter (Signed)
 Yes, refill carvedilol  please

## 2024-09-21 ENCOUNTER — Other Ambulatory Visit (HOSPITAL_BASED_OUTPATIENT_CLINIC_OR_DEPARTMENT_OTHER): Payer: Self-pay

## 2024-09-21 MED ORDER — CARVEDILOL 3.125 MG PO TABS
3.1250 mg | ORAL_TABLET | Freq: Two times a day (BID) | ORAL | 3 refills | Status: AC
  Filled 2024-09-21: qty 180, 90d supply, fill #0

## 2024-09-29 ENCOUNTER — Other Ambulatory Visit (HOSPITAL_BASED_OUTPATIENT_CLINIC_OR_DEPARTMENT_OTHER): Payer: Self-pay
